# Patient Record
Sex: Male | Born: 1980
Health system: Southern US, Community
[De-identification: ages and names within clinical notes are randomized; demographics above are authoritative.]

## PROBLEM LIST (undated history)

## (undated) DIAGNOSIS — K589 Irritable bowel syndrome without diarrhea: Secondary | ICD-10-CM

## (undated) DIAGNOSIS — J45909 Unspecified asthma, uncomplicated: Secondary | ICD-10-CM

## (undated) DIAGNOSIS — R0602 Shortness of breath: Secondary | ICD-10-CM

## (undated) DIAGNOSIS — J189 Pneumonia, unspecified organism: Secondary | ICD-10-CM

## (undated) DIAGNOSIS — I10 Essential (primary) hypertension: Secondary | ICD-10-CM

## (undated) DIAGNOSIS — M549 Dorsalgia, unspecified: Secondary | ICD-10-CM

## (undated) DIAGNOSIS — M7989 Other specified soft tissue disorders: Secondary | ICD-10-CM

## (undated) DIAGNOSIS — R12 Heartburn: Secondary | ICD-10-CM

## (undated) DIAGNOSIS — F909 Attention-deficit hyperactivity disorder, unspecified type: Secondary | ICD-10-CM

## (undated) DIAGNOSIS — R030 Elevated blood-pressure reading, without diagnosis of hypertension: Secondary | ICD-10-CM

## (undated) DIAGNOSIS — M2550 Pain in unspecified joint: Secondary | ICD-10-CM

## (undated) DIAGNOSIS — F988 Other specified behavioral and emotional disorders with onset usually occurring in childhood and adolescence: Secondary | ICD-10-CM

## (undated) HISTORY — DX: Essential (primary) hypertension: I10

## (undated) HISTORY — DX: Heartburn: R12

## (undated) HISTORY — DX: Dorsalgia, unspecified: M54.9

## (undated) HISTORY — DX: Elevated blood-pressure reading, without diagnosis of hypertension: R03.0

## (undated) HISTORY — DX: Other specified behavioral and emotional disorders with onset usually occurring in childhood and adolescence: F98.8

## (undated) HISTORY — DX: Irritable bowel syndrome, unspecified: K58.9

## (undated) HISTORY — DX: Pain in unspecified joint: M25.50

## (undated) HISTORY — DX: Attention-deficit hyperactivity disorder, unspecified type: F90.9

## (undated) HISTORY — DX: Other specified soft tissue disorders: M79.89

## (undated) HISTORY — DX: Shortness of breath: R06.02

## (undated) HISTORY — DX: Unspecified asthma, uncomplicated: J45.909

## (undated) HISTORY — DX: Pneumonia, unspecified organism: J18.9

---

## 2011-05-07 ENCOUNTER — Other Ambulatory Visit: Payer: Self-pay | Admitting: Family Medicine

## 2011-05-07 DIAGNOSIS — R52 Pain, unspecified: Secondary | ICD-10-CM

## 2011-05-11 ENCOUNTER — Ambulatory Visit (HOSPITAL_COMMUNITY)
Admission: RE | Admit: 2011-05-11 | Discharge: 2011-05-11 | Disposition: A | Discharge status: Home / Self Care | Payer: BC Managed Care – PPO | Source: Ambulatory Visit | Attending: Family Medicine | Admitting: Family Medicine

## 2011-05-11 DIAGNOSIS — M545 Low back pain, unspecified: Secondary | ICD-10-CM | POA: Insufficient documentation

## 2011-05-11 DIAGNOSIS — M5126 Other intervertebral disc displacement, lumbar region: Secondary | ICD-10-CM | POA: Insufficient documentation

## 2011-05-11 DIAGNOSIS — R52 Pain, unspecified: Secondary | ICD-10-CM

## 2013-07-04 ENCOUNTER — Other Ambulatory Visit: Payer: Self-pay | Admitting: Family Medicine

## 2013-10-29 ENCOUNTER — Other Ambulatory Visit: Payer: Self-pay | Admitting: Family Medicine

## 2014-02-20 ENCOUNTER — Other Ambulatory Visit: Payer: Self-pay | Admitting: Family Medicine

## 2014-05-11 ENCOUNTER — Other Ambulatory Visit: Payer: Self-pay | Admitting: Family Medicine

## 2014-05-11 ENCOUNTER — Other Ambulatory Visit: Payer: Self-pay | Admitting: *Deleted

## 2014-05-11 MED ORDER — ALBUTEROL SULFATE HFA 108 (90 BASE) MCG/ACT IN AERS: INHALATION_SPRAY | RESPIRATORY_TRACT | Status: DC

## 2014-05-11 NOTE — Telephone Encounter (Signed)
???  not sure of message???

## 2014-05-11 NOTE — Telephone Encounter (Signed)
Is this our patient?

## 2014-05-11 NOTE — Telephone Encounter (Signed)
yes

## 2014-05-12 ENCOUNTER — Other Ambulatory Visit: Payer: Self-pay | Admitting: *Deleted

## 2014-05-12 ENCOUNTER — Encounter: Payer: Self-pay | Admitting: *Deleted

## 2014-06-25 ENCOUNTER — Other Ambulatory Visit: Payer: Self-pay | Admitting: Family Medicine

## 2014-08-03 ENCOUNTER — Other Ambulatory Visit: Payer: Self-pay | Admitting: Family Medicine

## 2014-10-02 ENCOUNTER — Other Ambulatory Visit: Payer: Self-pay | Admitting: Family Medicine

## 2014-10-08 ENCOUNTER — Ambulatory Visit (INDEPENDENT_AMBULATORY_CARE_PROVIDER_SITE_OTHER): Payer: BC Managed Care – PPO | Admitting: Family Medicine

## 2014-10-08 ENCOUNTER — Encounter: Payer: Self-pay | Admitting: Family Medicine

## 2014-10-08 VITALS — BP 132/90 | Ht 75.0 in | Wt 334.0 lb

## 2014-10-08 DIAGNOSIS — J4530 Mild persistent asthma, uncomplicated: Secondary | ICD-10-CM

## 2014-10-08 MED ORDER — ALBUTEROL SULFATE HFA 108 (90 BASE) MCG/ACT IN AERS: INHALATION_SPRAY | RESPIRATORY_TRACT | Status: DC

## 2014-10-08 NOTE — Progress Notes (Signed)
   Subjective:    Patient ID: Randy BarrowsBryan Navarro, male    DOB: 1981-02-03, 33 y.o.   MRN: [id number]  HPI Patient is here today to get a refill on his inhaler.  He has not been seen in over 2 years.  He would like to be switched to the Ventolin inhaler.   Patient has been on steroid inhalers and Advair in the past. Stated it did not help much. He prefers to use when necessary albuterol.  Nonsmoker. Not exercising these days. Some exposure to fumes.  Review of Systems No headache no chest pain no back pain no abdominal pain no change in bowel habits ROS otherwise negative    Objective:   Physical Exam Alert no apparent distress. Vital stable HEENT normal. Lungs clear. Heart regular in rhythm.       Assessment & Plan:  Impression asthma mild persistent with patient declining preventive inhalers. #2 substantial obesity discussed plan wellness exam encourage. Ventolin/Albuterol 2 sprays every 6 hours when necessary

## 2015-08-12 ENCOUNTER — Other Ambulatory Visit: Payer: Self-pay | Admitting: Neurosurgery

## 2015-08-12 DIAGNOSIS — M5440 Lumbago with sciatica, unspecified side: Secondary | ICD-10-CM

## 2015-08-18 ENCOUNTER — Ambulatory Visit
Admission: RE | Admit: 2015-08-18 | Discharge: 2015-08-18 | Disposition: A | Discharge status: Home / Self Care | Payer: BLUE CROSS/BLUE SHIELD | Source: Ambulatory Visit | Attending: Neurosurgery | Admitting: Neurosurgery

## 2015-08-18 DIAGNOSIS — M5440 Lumbago with sciatica, unspecified side: Secondary | ICD-10-CM

## 2015-11-07 ENCOUNTER — Other Ambulatory Visit: Payer: Self-pay | Admitting: *Deleted

## 2015-11-07 ENCOUNTER — Telehealth: Payer: Self-pay | Admitting: Family Medicine

## 2015-11-07 MED ORDER — ALBUTEROL SULFATE HFA 108 (90 BASE) MCG/ACT IN AERS: 2.0000 | INHALATION_SPRAY | RESPIRATORY_TRACT | Status: DC | PRN

## 2015-11-07 NOTE — Telephone Encounter (Signed)
Last seen 09/2014. Refilled one time. Pt notified.  Pt has scheduled ov.

## 2015-11-07 NOTE — Telephone Encounter (Signed)
Pt is needing a refill on his albuterol (PROVENTIL HFA;VENTOLIN HFA) 108 (90 BASE) MCG/ACT inhaler.        [facility]

## 2015-11-11 ENCOUNTER — Encounter: Payer: Self-pay | Admitting: Family Medicine

## 2015-11-11 ENCOUNTER — Ambulatory Visit (INDEPENDENT_AMBULATORY_CARE_PROVIDER_SITE_OTHER): Payer: 59 | Admitting: Family Medicine

## 2015-11-11 VITALS — BP 122/82 | Ht 75.0 in | Wt 342.2 lb

## 2015-11-11 DIAGNOSIS — Z139 Encounter for screening, unspecified: Secondary | ICD-10-CM

## 2015-11-11 DIAGNOSIS — J329 Chronic sinusitis, unspecified: Secondary | ICD-10-CM

## 2015-11-11 DIAGNOSIS — J4530 Mild persistent asthma, uncomplicated: Secondary | ICD-10-CM | POA: Diagnosis not present

## 2015-11-11 MED ORDER — ALBUTEROL SULFATE HFA 108 (90 BASE) MCG/ACT IN AERS: 2.0000 | INHALATION_SPRAY | RESPIRATORY_TRACT | Status: DC | PRN

## 2015-11-11 MED ORDER — PREDNISONE 20 MG PO TABS: ORAL_TABLET | ORAL | Status: DC

## 2015-11-11 MED ORDER — AMOXICILLIN-POT CLAVULANATE 875-125 MG PO TABS: ORAL_TABLET | ORAL | Status: DC

## 2015-11-11 NOTE — Progress Notes (Signed)
   Subjective:    Patient ID: Randy Navarro, male    DOB: May 08, 1981, 35 y.o.   MRN: [id number]  Asthma This is a chronic problem. The current episode started more than 1 year ago. His past medical history is significant for asthma.   Patient also has c/o acute  cough. Onset few weeks ago.still needing inhaler around twice per before sickness was around twice per wk  Patient also requesting orders for routine labs.  Around ten yrs.  Pt recognizes ovrweight and so starting to work ondiet     Review of Systems No headache no chest pain no back pain no abdominal pain    Objective:   Physical Exam Alert vitals stable. HEENT moderate his congestion pharynx normal lungs rare expiratory wheeze bronchial cough heart regular rhythm       Assessment & Plan:  Impression subacute rhinosinusitis/bronchitis with exacerbation of asthma plan prednisone taper. Antibiotics prescribed. Albuterol when necessary WSL screening blood work

## 2016-04-15 MED FILL — VENTOLIN HFA 90 MCG INHALER: 108 (90 BAS | 16 days supply | Qty: 18 | Fill #0

## 2016-06-27 DIAGNOSIS — J45909 Unspecified asthma, uncomplicated: Secondary | ICD-10-CM | POA: Diagnosis not present

## 2016-06-27 DIAGNOSIS — J3489 Other specified disorders of nose and nasal sinuses: Secondary | ICD-10-CM | POA: Diagnosis not present

## 2016-07-01 MED FILL — VENTOLIN HFA 90 MCG INHALER: 108 (90 BAS | 16 days supply | Qty: 18 | Fill #1

## 2016-09-21 MED FILL — VENTOLIN HFA 90 MCG INHALER: 108 (90 BAS | 16 days supply | Qty: 18 | Fill #2

## 2016-11-02 DIAGNOSIS — Z3141 Encounter for fertility testing: Secondary | ICD-10-CM | POA: Diagnosis not present

## 2016-11-27 ENCOUNTER — Other Ambulatory Visit: Payer: Self-pay | Admitting: *Deleted

## 2016-11-27 MED ORDER — ALBUTEROL SULFATE HFA 108 (90 BASE) MCG/ACT IN AERS: 2.0000 | INHALATION_SPRAY | RESPIRATORY_TRACT | 0 refills | Status: DC | PRN

## 2017-01-26 ENCOUNTER — Telehealth: Payer: Self-pay | Admitting: *Deleted

## 2017-01-26 ENCOUNTER — Other Ambulatory Visit: Payer: Self-pay

## 2017-01-26 ENCOUNTER — Other Ambulatory Visit: Payer: Self-pay | Admitting: *Deleted

## 2017-01-26 MED ORDER — ALBUTEROL SULFATE HFA 108 (90 BASE) MCG/ACT IN AERS: 2.0000 | INHALATION_SPRAY | RESPIRATORY_TRACT | 2 refills | Status: DC | PRN

## 2017-01-26 NOTE — Telephone Encounter (Signed)
Requesting refill on albuterol inhaler. Last seen 11/11/15. [facility]. Please call after refill sent.

## 2017-01-26 NOTE — Telephone Encounter (Signed)
Prescription faxed to pharmacy. Patient notified. 

## 2017-01-26 NOTE — Telephone Encounter (Signed)
May rx plus two ref

## 2017-01-27 MED FILL — VENTOLIN HFA 90 MCG INHALER: 108 (90 BAS | 16 days supply | Qty: 18 | Fill #0

## 2017-03-17 MED FILL — VENTOLIN HFA 90 MCG INHALER: 108 (90 BAS | 16 days supply | Qty: 18 | Fill #1

## 2017-05-25 MED FILL — VENTOLIN HFA 90 MCG INHALER: 108 (90 BAS | 16 days supply | Qty: 18 | Fill #2

## 2017-08-23 MED FILL — VENTOLIN HFA 90 MCG INHALER: 108 (90 BAS | 16 days supply | Qty: 18 | Fill #0

## 2017-11-18 ENCOUNTER — Other Ambulatory Visit: Payer: Self-pay | Admitting: Family Medicine

## 2017-11-19 MED FILL — VENTOLIN HFA 90 MCG INHALER: 108 (90 BAS | 16 days supply | Qty: 18 | Fill #0

## 2017-11-19 NOTE — Telephone Encounter (Signed)
Ok times one 

## 2017-12-03 ENCOUNTER — Ambulatory Visit (INDEPENDENT_AMBULATORY_CARE_PROVIDER_SITE_OTHER): Payer: 59 | Admitting: Family Medicine

## 2017-12-03 ENCOUNTER — Encounter: Payer: Self-pay | Admitting: Family Medicine

## 2017-12-03 VITALS — BP 132/86 | Temp 98.2°F | Wt 371.6 lb

## 2017-12-03 DIAGNOSIS — J4521 Mild intermittent asthma with (acute) exacerbation: Secondary | ICD-10-CM

## 2017-12-03 DIAGNOSIS — J329 Chronic sinusitis, unspecified: Secondary | ICD-10-CM | POA: Diagnosis not present

## 2017-12-03 MED ORDER — CLARITHROMYCIN 500 MG PO TABS: 500.0000 mg | ORAL_TABLET | Freq: Two times a day (BID) | ORAL | 0 refills | Status: DC

## 2017-12-03 MED ORDER — PREDNISONE 20 MG PO TABS: ORAL_TABLET | ORAL | 0 refills | Status: DC

## 2017-12-03 MED ORDER — ALBUTEROL SULFATE HFA 108 (90 BASE) MCG/ACT IN AERS: INHALATION_SPRAY | RESPIRATORY_TRACT | 3 refills | Status: DC

## 2017-12-03 NOTE — Progress Notes (Signed)
   Subjective:    Patient ID: Randy Navarro, male    DOB: 1980-11-03, 37 y.o.   MRN: [id number]  Cough  This is a new problem. The current episode started 1 to 4 weeks ago. Associated symptoms include nasal congestion and wheezing. Treatments tried: sinus med and inhaler.    Cong nd cough and sickness   Some productive cough  Using inhaler two three times per day   Feeling tiht at times  Notes ongoing symtoms     Review of Systems  Respiratory: Positive for cough and wheezing.        Objective:   Physical Exam  Alert active  no significant malaise HEENT mild nasal congestion otherwise normal pharynx normal lungs bronchial cough expiratory wheezes no tachypnea      Assessment & Plan:  Impression rhinosinusitis/bronchitis with exacerbation of reactive airways plan prednisone taper.  Albuterol.  Antibiotics prescribed symptom care discussed

## 2017-12-21 ENCOUNTER — Encounter: Payer: Self-pay | Admitting: Family Medicine

## 2017-12-21 ENCOUNTER — Ambulatory Visit (INDEPENDENT_AMBULATORY_CARE_PROVIDER_SITE_OTHER): Payer: 59 | Admitting: Family Medicine

## 2017-12-21 VITALS — BP 112/76 | Temp 98.9°F | Ht 75.0 in | Wt 366.0 lb

## 2017-12-21 DIAGNOSIS — J4541 Moderate persistent asthma with (acute) exacerbation: Secondary | ICD-10-CM | POA: Diagnosis not present

## 2017-12-21 MED ORDER — FLUTICASONE PROPIONATE HFA 44 MCG/ACT IN AERO: 2.0000 | INHALATION_SPRAY | Freq: Two times a day (BID) | RESPIRATORY_TRACT | 0 refills | Status: DC

## 2017-12-21 MED ORDER — PREDNISONE 20 MG PO TABS: 20.0000 mg | ORAL_TABLET | Freq: Every day | ORAL | 0 refills | Status: DC

## 2017-12-21 NOTE — Progress Notes (Signed)
   Subjective:    Patient ID: Randy BarrowsBryan Navarro, male    DOB: 15-Mar-1981, 37 y.o.   MRN: [id number]  Cough  This is a new problem. Episode onset: one month. Associated symptoms include headaches and wheezing. Associated symptoms comments: Vomiting, sore throat. Treatments tried: tylenol severe sinus, biaxin, prednisone, nyquil.    Saw three wks ago  Persist cough and whheezing  Productive at times gunky phlegm  Day and night coughing  Using oinhaler wseeral times per day  Generally when coughing a lot  No smoke in te house         Review of Systems  Respiratory: Positive for cough and wheezing.   Neurological: Positive for headaches.       Objective:   Physical Exam  Alert active good hydration mild nasal congestion pharynx normal lungs positive mild expiratory wheezes.  No tachypnea heart regular rate and rhythm.      Assessment & Plan:  Impression protracted flare of asthma after respiratory illness.  Patient has been on steroid inhalers in the past but it has been quite a few years.  Recommend aggressive treatment of allergies this spring.  Prednisone taper.  Will resume steroid inhaler and encouraged to maintain until early some

## 2018-01-03 ENCOUNTER — Other Ambulatory Visit: Payer: Self-pay | Admitting: Family Medicine

## 2018-01-11 MED FILL — VENTOLIN HFA 90 MCG INHALER: 108 (90 BAS | 16 days supply | Qty: 18 | Fill #0

## 2018-01-17 ENCOUNTER — Other Ambulatory Visit: Payer: Self-pay | Admitting: Family Medicine

## 2018-02-15 ENCOUNTER — Other Ambulatory Visit: Payer: Self-pay | Admitting: Family Medicine

## 2018-02-15 MED FILL — VENTOLIN HFA 90 MCG INHALER: 108 (90 BAS | 16 days supply | Qty: 18 | Fill #0

## 2018-03-21 MED FILL — VENTOLIN HFA 90 MCG INHALER: 108 (90 BAS | 16 days supply | Qty: 18 | Fill #1

## 2018-04-04 ENCOUNTER — Other Ambulatory Visit: Payer: Self-pay | Admitting: Family Medicine

## 2018-04-05 MED FILL — VENTOLIN HFA 90 MCG INHALER: 108 (90 BAS | 16 days supply | Qty: 18 | Fill #0

## 2018-11-08 ENCOUNTER — Other Ambulatory Visit: Payer: Self-pay | Admitting: Family Medicine

## 2018-11-17 MED FILL — VENTOLIN HFA 90 MCG INHALER: 108 (90 BAS | 16 days supply | Qty: 18 | Fill #1

## 2019-01-18 ENCOUNTER — Other Ambulatory Visit: Payer: Self-pay | Admitting: Family Medicine

## 2019-01-18 MED FILL — ALBUTEROL SULFATE HFA 108 (: 108 (90 BAS | 25 days supply | Qty: 9 | Fill #0

## 2019-01-18 NOTE — Telephone Encounter (Signed)
One yr virtual visit

## 2019-03-13 ENCOUNTER — Other Ambulatory Visit: Payer: Self-pay

## 2019-03-13 ENCOUNTER — Ambulatory Visit (INDEPENDENT_AMBULATORY_CARE_PROVIDER_SITE_OTHER): Payer: 59 | Admitting: Family Medicine

## 2019-03-13 ENCOUNTER — Encounter: Payer: Self-pay | Admitting: Family Medicine

## 2019-03-13 ENCOUNTER — Telehealth: Payer: Self-pay | Admitting: Family Medicine

## 2019-03-13 DIAGNOSIS — Z79899 Other long term (current) drug therapy: Secondary | ICD-10-CM

## 2019-03-13 DIAGNOSIS — Z13220 Encounter for screening for lipoid disorders: Secondary | ICD-10-CM | POA: Diagnosis not present

## 2019-03-13 DIAGNOSIS — R5383 Other fatigue: Secondary | ICD-10-CM | POA: Diagnosis not present

## 2019-03-13 DIAGNOSIS — J4541 Moderate persistent asthma with (acute) exacerbation: Secondary | ICD-10-CM

## 2019-03-13 MED ORDER — ALBUTEROL SULFATE HFA 108 (90 BASE) MCG/ACT IN AERS: INHALATION_SPRAY | RESPIRATORY_TRACT | 5 refills | Status: DC

## 2019-03-13 MED FILL — ALBUTEROL SULFATE HFA 108 (: 108 (90 BAS | 16 days supply | Qty: 18 | Fill #0

## 2019-03-13 NOTE — Progress Notes (Signed)
   Subjective:    Patient ID: Randy Navarro, male    DOB: Mar 09, 1981, 38 y.o.   MRN: [id number] Audio only  Patient calls with numerous concerns HPI Pt is here for follow up . Pt states his asthma has been pretty much under control. Pt states he would like to have a sleep study and routine blood work done. Pt states he does need refills on all meds.   Virtual Visit via Video Note  I connected with Randy Navarro on 03/13/19 at  1:40 PM EDT by a video enabled telemedicine application and verified that I am speaking with the correct person using two identifiers.  Location: Patient: home Provider: office   I discussed the limitations of evaluation and management by telemedicine and the availability of in person appointments. The patient expressed understanding and agreed to proceed.  History of Present Illness:    Observations/Objective:   Assessment and Plan:   Follow Up Instructions: Pt snores a lor, deep snores,   Periods of apnea, souse jostles   Daytime tiredness and fatigue   Snoring getting worse     I discussed the assessment and treatment plan with the patient. The patient was provided an opportunity to ask questions and all were answered. The patient agreed with the plan and demonstrated an understanding of the instructions.   The patient was advised to call back or seek an in-person evaluation if the symptoms worsen or if the condition fails to improve as anticipated.  I provided 25 minutes of non-face-to-face time during this encounter.  Asthma not as much of a problem.  Uses PRN albuterol.  Stopped using the steroid inhaler.  Concerned about ongoing weight gain.  Admits his diet is not very good.  Some family history of diabetes.  Would like some screening blood work to assess.  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  Morbid obesity ongoing weight gain dietary noncompliance.  Would like to do some blood work  3.  Probable sleep apnea.  Patient requests testing.  I think this is appropriate.  2 out of 3 positive with the [employer]

## 2019-03-13 NOTE — Telephone Encounter (Addendum)
ERROR

## 2019-04-05 ENCOUNTER — Telehealth: Payer: Self-pay | Admitting: Family Medicine

## 2019-04-05 DIAGNOSIS — R5383 Other fatigue: Secondary | ICD-10-CM

## 2019-04-05 NOTE — Telephone Encounter (Signed)
Pt called to request the order for the sleep study to be specifically for a home sleep test  Please advise & call pt when order has been placed

## 2019-04-06 NOTE — Telephone Encounter (Signed)
Home sleep study ordered in Epic. Patient notified of process and verbalized understanding.

## 2019-04-06 NOTE — Telephone Encounter (Signed)
Please go ahead with this order Nurses put in the order Then forward message to [facility] indicating message/order was put it When this is set up please make sure that the patient is aware of the process and that they should hear something within 3 weeks of the test if they have not heard anything regarding the results to let us know

## 2019-05-05 ENCOUNTER — Telehealth: Payer: Self-pay | Admitting: Family Medicine

## 2019-05-05 MED ORDER — CHLORZOXAZONE 500 MG PO TABS: ORAL_TABLET | ORAL | 0 refills | Status: DC

## 2019-05-05 NOTE — Telephone Encounter (Signed)
Patient pulled a muscle in his back. Making it hard to sleep at night and was wanting to get a muscle relaxer called in. No other symptoms.  No appts left for today to do a phone visit.  I told patient I would put a note back but couldn't guarantee we could call anything in.  [facility]

## 2019-05-05 NOTE — Telephone Encounter (Signed)
Please advise. Thank you

## 2019-05-05 NOTE — Telephone Encounter (Signed)
I recommend Parafon forte 500 mg, 1 taken 3 times daily as needed, #20, home use only If ongoing trouble follow-up here or urgent care

## 2019-05-05 NOTE — Telephone Encounter (Signed)
Parafon Forte 500 mg sent in to pharmacy. Pt has been notified and verbalized understanding.

## 2019-05-11 ENCOUNTER — Telehealth: Payer: Self-pay | Admitting: Family Medicine

## 2019-05-11 ENCOUNTER — Other Ambulatory Visit: Payer: Self-pay | Admitting: Family Medicine

## 2019-05-11 ENCOUNTER — Other Ambulatory Visit: Payer: Self-pay | Admitting: *Deleted

## 2019-05-11 MED ORDER — PREDNISONE 20 MG PO TABS: 20.0000 mg | ORAL_TABLET | Freq: Every day | ORAL | 0 refills | Status: DC

## 2019-05-11 MED ORDER — CHLORZOXAZONE 500 MG PO TABS: ORAL_TABLET | ORAL | 0 refills | Status: DC

## 2019-05-11 NOTE — Telephone Encounter (Signed)
[facility] if he has ongoing troubles definitely follow through with specialist as well

## 2019-05-11 NOTE — Telephone Encounter (Signed)
Pt would like to know if a steroid could be called in for back pian. He is scheduled to see an ortho doctor about an injection.   [facility]

## 2019-05-11 NOTE — Telephone Encounter (Signed)
Refill sent. Pt notified.

## 2019-05-11 NOTE — Telephone Encounter (Signed)
Discussed with pt and he also wanted to know if you could refill chloroxazone as well.

## 2019-05-11 NOTE — Telephone Encounter (Signed)
May have refill of the chlorzoxazone x1

## 2019-05-18 ENCOUNTER — Other Ambulatory Visit: Payer: Self-pay | Admitting: Sports Medicine

## 2019-05-18 DIAGNOSIS — M545 Low back pain, unspecified: Secondary | ICD-10-CM

## 2019-05-18 DIAGNOSIS — M5136 Other intervertebral disc degeneration, lumbar region: Secondary | ICD-10-CM | POA: Diagnosis not present

## 2019-05-22 DIAGNOSIS — S338XXA Sprain of other parts of lumbar spine and pelvis, initial encounter: Secondary | ICD-10-CM | POA: Diagnosis not present

## 2019-05-22 DIAGNOSIS — M4125 Other idiopathic scoliosis, thoracolumbar region: Secondary | ICD-10-CM | POA: Diagnosis not present

## 2019-05-22 DIAGNOSIS — S134XXA Sprain of ligaments of cervical spine, initial encounter: Secondary | ICD-10-CM | POA: Diagnosis not present

## 2019-05-23 ENCOUNTER — Ambulatory Visit: Payer: BLUE CROSS/BLUE SHIELD | Admitting: Orthopaedic Surgery

## 2019-05-26 DIAGNOSIS — M4125 Other idiopathic scoliosis, thoracolumbar region: Secondary | ICD-10-CM | POA: Diagnosis not present

## 2019-05-26 DIAGNOSIS — S338XXA Sprain of other parts of lumbar spine and pelvis, initial encounter: Secondary | ICD-10-CM | POA: Diagnosis not present

## 2019-05-26 DIAGNOSIS — S134XXA Sprain of ligaments of cervical spine, initial encounter: Secondary | ICD-10-CM | POA: Diagnosis not present

## 2019-06-23 ENCOUNTER — Other Ambulatory Visit: Payer: Self-pay

## 2019-06-23 ENCOUNTER — Ambulatory Visit
Admission: RE | Admit: 2019-06-23 | Discharge: 2019-06-23 | Disposition: A | Discharge status: Home / Self Care | Payer: BLUE CROSS/BLUE SHIELD | Source: Ambulatory Visit | Attending: Sports Medicine | Admitting: Sports Medicine

## 2019-06-23 DIAGNOSIS — M545 Low back pain, unspecified: Secondary | ICD-10-CM

## 2019-06-26 DIAGNOSIS — M5136 Other intervertebral disc degeneration, lumbar region: Secondary | ICD-10-CM | POA: Diagnosis not present

## 2019-06-26 DIAGNOSIS — M545 Low back pain: Secondary | ICD-10-CM | POA: Diagnosis not present

## 2019-06-26 DIAGNOSIS — M4125 Other idiopathic scoliosis, thoracolumbar region: Secondary | ICD-10-CM | POA: Diagnosis not present

## 2019-06-26 DIAGNOSIS — S134XXA Sprain of ligaments of cervical spine, initial encounter: Secondary | ICD-10-CM | POA: Diagnosis not present

## 2019-06-26 DIAGNOSIS — M47816 Spondylosis without myelopathy or radiculopathy, lumbar region: Secondary | ICD-10-CM | POA: Diagnosis not present

## 2019-06-26 DIAGNOSIS — S338XXA Sprain of other parts of lumbar spine and pelvis, initial encounter: Secondary | ICD-10-CM | POA: Diagnosis not present

## 2019-07-20 MED FILL — ALBUTEROL SULFATE HFA 108 (: 108 (90 BAS | 16 days supply | Qty: 18 | Fill #1

## 2019-08-07 DIAGNOSIS — M4125 Other idiopathic scoliosis, thoracolumbar region: Secondary | ICD-10-CM | POA: Diagnosis not present

## 2019-08-07 DIAGNOSIS — S134XXA Sprain of ligaments of cervical spine, initial encounter: Secondary | ICD-10-CM | POA: Diagnosis not present

## 2019-08-07 DIAGNOSIS — S338XXA Sprain of other parts of lumbar spine and pelvis, initial encounter: Secondary | ICD-10-CM | POA: Diagnosis not present

## 2019-08-14 DIAGNOSIS — S338XXA Sprain of other parts of lumbar spine and pelvis, initial encounter: Secondary | ICD-10-CM | POA: Diagnosis not present

## 2019-08-14 DIAGNOSIS — S134XXA Sprain of ligaments of cervical spine, initial encounter: Secondary | ICD-10-CM | POA: Diagnosis not present

## 2019-08-14 DIAGNOSIS — M4125 Other idiopathic scoliosis, thoracolumbar region: Secondary | ICD-10-CM | POA: Diagnosis not present

## 2019-10-10 MED FILL — ALBUTEROL SULFATE HFA 108 (: 108 (90 BAS | 16 days supply | Qty: 18 | Fill #2

## 2019-10-11 ENCOUNTER — Encounter: Payer: Self-pay | Admitting: Family Medicine

## 2019-10-11 ENCOUNTER — Ambulatory Visit: Payer: BLUE CROSS/BLUE SHIELD | Attending: Internal Medicine

## 2019-10-11 ENCOUNTER — Ambulatory Visit (INDEPENDENT_AMBULATORY_CARE_PROVIDER_SITE_OTHER): Payer: 59 | Admitting: Family Medicine

## 2019-10-11 ENCOUNTER — Other Ambulatory Visit: Payer: Self-pay

## 2019-10-11 DIAGNOSIS — J329 Chronic sinusitis, unspecified: Secondary | ICD-10-CM | POA: Diagnosis not present

## 2019-10-11 DIAGNOSIS — J310 Chronic rhinitis: Secondary | ICD-10-CM | POA: Diagnosis not present

## 2019-10-11 DIAGNOSIS — Z20828 Contact with and (suspected) exposure to other viral communicable diseases: Secondary | ICD-10-CM | POA: Diagnosis not present

## 2019-10-11 DIAGNOSIS — Z20822 Contact with and (suspected) exposure to covid-19: Secondary | ICD-10-CM

## 2019-10-11 MED ORDER — CEFDINIR 300 MG PO CAPS: ORAL_CAPSULE | ORAL | 0 refills | Status: DC

## 2019-10-11 NOTE — Progress Notes (Signed)
   Subjective:  Audio only  Patient ID: Randy Navarro, male    DOB: [birth year], 38 y.o.   MRN: [id number]  Sinusitis This is a new problem. The current episode started in the past 7 days. Associated symptoms include congestion. (Drainage at night, sore throat in the morning.) Treatments tried: Tylenol Cold and Sinus. The treatment provided mild relief.   Virtual Visit via Telephone Note  I connected with Randy Navarro on 10/11/19 at  9:30 AM EST by telephone and verified that I am speaking with the correct person using two identifiers.  Location: Patient: home Provider: office   I discussed the limitations, risks, security and privacy concerns of performing an evaluation and management service by telephone and the availability of in person appointments. I also discussed with the patient that there may be a patient responsible charge related to this service. The patient expressed understanding and agreed to proceed.   History of Present Illness:    Observations/Objective:   Assessment and Plan:   Follow Up Instructions:    I discussed the assessment and treatment plan with the patient. The patient was provided an opportunity to ask questions and all were answered. The patient agreed with the plan and demonstrated an understanding of the instructions.   The patient was advised to call back or seek an in-person evaluation if the symptoms worsen or if the condition fails to improve as anticipated.  I provided 20 minutes of non-face-to-face time during this encounter. Alert, mild malaise. Hydration good Vitals stable. frontal/ maxillary tenderness evident positive nasal congestion. pharynx normal neck supple  lungs clear/no crackles or wheezes. heart regular in rhythm Patient has history of reactive airways.  Notes minimal wheezing with this episode.  Occasional cough  Randy Males, LPN  Started  3 d ago  Sinus drainage in the moern    Sore throt   No known exposure  Works  at [employer]  All wears a mask  Mild ha in front  No gunky stufff  No wheezing  Review of Systems  HENT: Positive for congestion.        Objective:   Physical Exam  Virtual      Assessment & Plan:  Impression potential rhinosinusitis.  Antibiotics prescribed symptom care discussed.  Albuterol as needed for wheezing.  Recommend COVID-19 testing rationale discussed

## 2019-10-13 LAB — NOVEL CORONAVIRUS, NAA: SARS-CoV-2, NAA: NOT DETECTED

## 2019-10-18 ENCOUNTER — Encounter: Payer: Self-pay | Admitting: Family Medicine

## 2019-10-18 ENCOUNTER — Telehealth: Payer: Self-pay | Admitting: Family Medicine

## 2019-10-18 NOTE — Telephone Encounter (Signed)
Ok, write i'll sign

## 2019-10-18 NOTE — Telephone Encounter (Signed)
done

## 2019-10-18 NOTE — Telephone Encounter (Signed)
Please advise. Thank you

## 2019-10-18 NOTE — Telephone Encounter (Signed)
Please give patient work note via [employer]. Pt is needing it to say he is cleared to come back to work. Thank you.

## 2019-10-18 NOTE — Telephone Encounter (Signed)
Pt is needing a note stating he can go back to work. He is unable to go back to work until he has a work note. Pt is not having more symptoms and is feeling better.

## 2019-10-27 ENCOUNTER — Other Ambulatory Visit: Payer: BLUE CROSS/BLUE SHIELD

## 2019-10-30 ENCOUNTER — Ambulatory Visit: Payer: BLUE CROSS/BLUE SHIELD | Attending: Internal Medicine

## 2019-10-30 ENCOUNTER — Other Ambulatory Visit: Payer: Self-pay

## 2019-10-30 DIAGNOSIS — Z20822 Contact with and (suspected) exposure to covid-19: Secondary | ICD-10-CM | POA: Diagnosis not present

## 2019-10-31 LAB — NOVEL CORONAVIRUS, NAA: SARS-CoV-2, NAA: NOT DETECTED

## 2019-11-08 ENCOUNTER — Telehealth: Payer: Self-pay | Admitting: Family Medicine

## 2019-11-08 NOTE — Telephone Encounter (Signed)
Pt seen 10/11/2019 for sinus issues; had test on 10/30/2019. Please advise. Thank you

## 2019-11-08 NOTE — Telephone Encounter (Signed)
Pt is requesting a work note for 1/15-1/27 to return to work tomorrow. He was exposed to covid and had to quarantine and also had the test done as well.

## 2019-11-08 NOTE — Telephone Encounter (Signed)
Yes wis

## 2019-11-09 ENCOUNTER — Encounter: Payer: Self-pay | Admitting: Family Medicine

## 2019-11-13 ENCOUNTER — Encounter: Payer: Self-pay | Admitting: Family Medicine

## 2020-01-31 MED FILL — ALBUTEROL SULFATE HFA 108 (: 108 (90 BAS | 16 days supply | Qty: 18 | Fill #3

## 2020-04-02 ENCOUNTER — Other Ambulatory Visit: Payer: Self-pay | Admitting: Family Medicine

## 2020-04-02 MED FILL — ALBUTEROL SULFATE HFA 108 (: 108 (90 BAS | 17 days supply | Qty: 18 | Fill #0

## 2020-04-02 NOTE — Telephone Encounter (Signed)
Please contact patient to set up appt; then may route back to nurses. Thank you  

## 2020-04-02 NOTE — Telephone Encounter (Signed)
Seen 10/11/19 for wheezing and has an upcoming appt

## 2020-04-02 NOTE — Telephone Encounter (Signed)
Scheduled 7/20

## 2020-04-05 DIAGNOSIS — H53021 Refractive amblyopia, right eye: Secondary | ICD-10-CM | POA: Diagnosis not present

## 2020-04-05 DIAGNOSIS — H52223 Regular astigmatism, bilateral: Secondary | ICD-10-CM | POA: Diagnosis not present

## 2020-04-05 DIAGNOSIS — H5203 Hypermetropia, bilateral: Secondary | ICD-10-CM | POA: Diagnosis not present

## 2020-04-30 ENCOUNTER — Ambulatory Visit: Payer: 59 | Admitting: Family Medicine

## 2020-04-30 ENCOUNTER — Encounter: Payer: Self-pay | Admitting: Family Medicine

## 2020-04-30 ENCOUNTER — Other Ambulatory Visit: Payer: Self-pay

## 2020-04-30 VITALS — BP 132/86 | HR 85 | Temp 97.0°F | Ht 75.0 in | Wt 399.4 lb

## 2020-04-30 DIAGNOSIS — J4530 Mild persistent asthma, uncomplicated: Secondary | ICD-10-CM | POA: Diagnosis not present

## 2020-04-30 DIAGNOSIS — Z0000 Encounter for general adult medical examination without abnormal findings: Secondary | ICD-10-CM

## 2020-04-30 DIAGNOSIS — M722 Plantar fascial fibromatosis: Secondary | ICD-10-CM

## 2020-04-30 NOTE — Patient Instructions (Addendum)
1 wk before physical get labs, fasting.  Plantar Fasciitis Rehab Ask your health care provider which exercises are safe for you. Do exercises exactly as told by your health care provider and adjust them as directed. It is normal to feel mild stretching, pulling, tightness, or discomfort as you do these exercises. Stop right away if you feel sudden pain or your pain gets worse. Do not begin these exercises until told by your health care provider. Stretching and range-of-motion exercises These exercises warm up your muscles and joints and improve the movement and flexibility of your foot. These exercises also help to relieve pain. Plantar fascia stretch  1. Sit with your left / right leg crossed over your opposite knee. 2. Hold your heel with one hand with that thumb near your arch. With your other hand, hold your toes and gently pull them back toward the top of your foot. You should feel a stretch on the bottom of your toes or your foot (plantar fascia) or both. 3. Hold this stretch for__________ seconds. 4. Slowly release your toes and return to the starting position. Repeat __________ times. Complete this exercise __________ times a day. Gastrocnemius stretch, standing This exercise is also called a calf (gastroc) stretch. It stretches the muscles in the back of the upper calf. 1. Stand with your hands against a wall. 2. Extend your left / right leg behind you, and bend your front knee slightly. 3. Keeping your heels on the floor and your back knee straight, shift your weight toward the wall. Do not arch your back. You should feel a gentle stretch in your upper left / right calf. 4. Hold this position for __________ seconds. Repeat __________ times. Complete this exercise __________ times a day. Soleus stretch, standing This exercise is also called a calf (soleus) stretch. It stretches the muscles in the back of the lower calf. 1. Stand with your hands against a wall. 2. Extend your left /  right leg behind you, and bend your front knee slightly. 3. Keeping your heels on the floor, bend your back knee and shift your weight slightly over your back leg. You should feel a gentle stretch deep in your lower calf. 4. Hold this position for __________ seconds. Repeat __________ times. Complete this exercise __________ times a day. Gastroc and soleus stretch, standing step This exercise stretches the muscles in the back of the lower leg. These muscles are in the upper calf (gastrocnemius) and the lower calf (soleus). 1. Stand with the ball of your left / right foot on a step. The ball of your foot is on the walking surface, right under your toes. 2. Keep your other foot firmly on the same step. 3. Hold on to the wall or a railing for balance. 4. Slowly lift your other foot, allowing your body weight to press your left / right heel down over the edge of the step. You should feel a stretch in your left / right calf. 5. Hold this position for __________ seconds. 6. Return both feet to the step. 7. Repeat this exercise with a slight bend in your left / right knee. Repeat __________ times with your left / right knee straight and __________ times with your left / right knee bent. Complete this exercise __________ times a day. Balance exercise This exercise builds your balance and strength control of your arch to help take pressure off your plantar fascia. Single leg stand If this exercise is too easy, you can try it with your eyes  closed or while standing on a pillow. 1. Without shoes, stand near a railing or in a doorway. You may hold on to the railing or door frame as needed. 2. Stand on your left / right foot. Keep your big toe down on the floor and try to keep your arch lifted. Do not let your foot roll inward. 3. Hold this position for __________ seconds. Repeat __________ times. Complete this exercise __________ times a day. This information is not intended to replace advice given to you  by your health care provider. Make sure you discuss any questions you have with your health care provider. Document Revised: 01/19/2019 Document Reviewed: 07/27/2018 Elsevier Patient Education  2020 [employer].

## 2020-04-30 NOTE — Progress Notes (Signed)
Patient ID: Randy Navarro, male    DOB: 04/12/1981, 39 y.o.   MRN: [id number]   Chief Complaint  Patient presents with  . asthma follow up   Subjective:    HPI  Pt seen for f/u asthma. Pt hasn't had covid this past year. Feeling well.  No new breathing issues. Using albuterol and flovent. Not taking flovent daily. Using albuterol as needed 1x per day or none. No night time cough or wheezing.  Mom- diabetes on mom side,  Father side of family- heart attacks. Cancers-none that know of.  Medical History Randy Navarro has a past medical history of ADHD (attention deficit hyperactivity disorder), Asthma, Blood pressure elevated without history of HTN, IBS (irritable bowel syndrome), and Pneumonia (age 8 year).   Outpatient Encounter Medications as of 04/30/2020  Medication Sig  . albuterol (VENTOLIN HFA) 108 (90 Base) MCG/ACT inhaler INHALE 2 PUFFS BY MOUTH INTO LUNGS EVERY 4 HOURS AS NEEDED FOR WHEEZING OR SHORTNESS OF BREATH  . FLOVENT HFA 44 MCG/ACT inhaler TAKE 2 PUFFS BY MOUTH TWICE A DAY (Patient not taking: Reported on 04/30/2020)  . [DISCONTINUED] cefdinir (OMNICEF) 300 MG capsule Take one capsule po BID for 10 days  . [DISCONTINUED] chlorzoxazone (PARAFON FORTE DSC) 500 MG tablet Take one tablet by mouth 3 times day prn. HOME USE ONLY (Patient not taking: Reported on 10/11/2019)  . [DISCONTINUED] predniSONE (DELTASONE) 20 MG tablet Take 1 tablet (20 mg total) by mouth daily with breakfast. Take 3 qd for 3 days, then 2 qd for 3 days, then one qd for 3 days (Patient not taking: Reported on 10/11/2019)   No facility-administered encounter medications on file as of 04/30/2020.     Review of Systems  Constitutional: Negative for chills and fever.  HENT: Negative for congestion, rhinorrhea and sore throat.   Respiratory: Negative for cough, shortness of breath and wheezing.   Cardiovascular: Negative for chest pain and leg swelling.  Gastrointestinal: Negative for abdominal pain,  diarrhea, nausea and vomiting.  Genitourinary: Negative for dysuria and frequency.  Musculoskeletal:       +pain in inner heel/plantar of left foot.  Skin: Negative for rash.  Neurological: Negative for dizziness, weakness and headaches.     Vitals BP 132/86   Pulse 85   Temp (!) 97 F (36.1 C) (Oral)   Ht '6\' 3"'$  (1.905 m)   Wt (!) 399 lb 6.4 oz (181.2 kg)   SpO2 98%   BMI 49.92 kg/m   Objective:   Physical Exam Vitals and nursing note reviewed.  Constitutional:      General: He is not in acute distress.    Appearance: Normal appearance. He is obese. He is not ill-appearing.  HENT:     Head: Normocephalic.     Nose: Nose normal. No congestion.     Mouth/Throat:     Mouth: Mucous membranes are moist.     Pharynx: No oropharyngeal exudate.  Eyes:     Extraocular Movements: Extraocular movements intact.     Conjunctiva/sclera: Conjunctivae normal.     Pupils: Pupils are equal, round, and reactive to light.  Cardiovascular:     Rate and Rhythm: Normal rate and regular rhythm.     Pulses: Normal pulses.     Heart sounds: Normal heart sounds. No murmur heard.   Pulmonary:     Effort: Pulmonary effort is normal. No respiratory distress.     Breath sounds: Normal breath sounds. No wheezing, rhonchi or rales.  Musculoskeletal:  General: Normal range of motion.     Right lower leg: No edema.     Left lower leg: No edema.  Skin:    General: Skin is warm and dry.     Findings: No rash.  Neurological:     General: No focal deficit present.     Mental Status: He is alert and oriented to person, place, and time.     Cranial Nerves: No cranial nerve deficit.  Psychiatric:        Mood and Affect: Mood normal.        Behavior: Behavior normal.        Thought Content: Thought content normal.        Judgment: Judgment normal.      Assessment and Plan   1. Mild persistent asthma without complication  2. Laboratory tests ordered as part of a complete physical exam  (CPE) - CBC - CMP14+EGFR - Hemoglobin A1c - Lipid panel - TSH  3. Plantar fasciitis of left foot   Asthma-stable, controlled-cont flovent and albuterol prn.  Plantar fasciitis- Aleve bid, ice water bottle roller 3x per day.  Handout on exercises given. Call or rto if not improving in 2-3 wks.  F/u 47mofor cpe/labs

## 2020-05-11 DIAGNOSIS — Z23 Encounter for immunization: Secondary | ICD-10-CM | POA: Diagnosis not present

## 2020-06-08 DIAGNOSIS — Z23 Encounter for immunization: Secondary | ICD-10-CM | POA: Diagnosis not present

## 2020-07-02 MED FILL — ALBUTEROL SULFATE HFA 108 (: 108 (90 BAS | 17 days supply | Qty: 18 | Fill #1

## 2020-09-02 ENCOUNTER — Ambulatory Visit: Payer: 59 | Admitting: Family Medicine

## 2020-09-25 ENCOUNTER — Other Ambulatory Visit: Payer: Self-pay | Admitting: Family Medicine

## 2020-09-26 ENCOUNTER — Other Ambulatory Visit: Payer: Self-pay | Admitting: Family Medicine

## 2020-09-26 ENCOUNTER — Telehealth: Payer: Self-pay

## 2020-09-26 MED ORDER — ALBUTEROL SULFATE HFA 108 (90 BASE) MCG/ACT IN AERS: 2.0000 | INHALATION_SPRAY | Freq: Four times a day (QID) | RESPIRATORY_TRACT | 2 refills | Status: DC | PRN

## 2020-09-26 MED FILL — ALBUTEROL SULFATE HFA 108 (: 108 (90 BAS | 25 days supply | Qty: 18 | Fill #0

## 2020-09-26 NOTE — Telephone Encounter (Signed)
Pt contacted and verbalized understanding.  

## 2020-09-26 NOTE — Telephone Encounter (Signed)
Please advise. Thank you

## 2020-09-26 NOTE — Telephone Encounter (Signed)
Patient is checking on request for refill on albuterol 108 inhaler called into [place] [facility]

## 2020-10-12 ENCOUNTER — Encounter (HOSPITAL_COMMUNITY): Payer: Self-pay | Admitting: Emergency Medicine

## 2020-10-12 ENCOUNTER — Emergency Department (HOSPITAL_COMMUNITY)
Admission: EM | Admit: 2020-10-12 | Discharge: 2020-10-12 | Disposition: A | Discharge status: Home / Self Care | Payer: 59 | Attending: Emergency Medicine | Admitting: Emergency Medicine

## 2020-10-12 ENCOUNTER — Other Ambulatory Visit: Payer: Self-pay

## 2020-10-12 DIAGNOSIS — M549 Dorsalgia, unspecified: Secondary | ICD-10-CM | POA: Diagnosis not present

## 2020-10-12 DIAGNOSIS — J4530 Mild persistent asthma, uncomplicated: Secondary | ICD-10-CM | POA: Insufficient documentation

## 2020-10-12 DIAGNOSIS — M5441 Lumbago with sciatica, right side: Secondary | ICD-10-CM | POA: Insufficient documentation

## 2020-10-12 DIAGNOSIS — R52 Pain, unspecified: Secondary | ICD-10-CM | POA: Diagnosis not present

## 2020-10-12 DIAGNOSIS — F17210 Nicotine dependence, cigarettes, uncomplicated: Secondary | ICD-10-CM | POA: Diagnosis not present

## 2020-10-12 DIAGNOSIS — M5442 Lumbago with sciatica, left side: Secondary | ICD-10-CM | POA: Insufficient documentation

## 2020-10-12 DIAGNOSIS — R000 Tachycardia, unspecified: Secondary | ICD-10-CM | POA: Diagnosis not present

## 2020-10-12 MED ORDER — MORPHINE SULFATE (PF) 4 MG/ML IV SOLN
4.0000 mg | Freq: Once | INTRAVENOUS | Status: AC
  Administered 2020-10-12: 4 mg via INTRAVENOUS
  Filled 2020-10-12: qty 1

## 2020-10-12 MED ORDER — DEXAMETHASONE SODIUM PHOSPHATE 10 MG/ML IJ SOLN
10.0000 mg | Freq: Once | INTRAMUSCULAR | Status: AC
  Administered 2020-10-12: 10 mg via INTRAVENOUS
  Filled 2020-10-12: qty 1

## 2020-10-12 MED ORDER — OXYCODONE-ACETAMINOPHEN 5-325 MG PO TABS: 1.0000 | ORAL_TABLET | Freq: Four times a day (QID) | ORAL | 0 refills | Status: DC | PRN

## 2020-10-12 MED ORDER — METHOCARBAMOL 500 MG PO TABS: 500.0000 mg | ORAL_TABLET | Freq: Two times a day (BID) | ORAL | 0 refills | Status: DC

## 2020-10-12 MED ORDER — KETOROLAC TROMETHAMINE 30 MG/ML IJ SOLN
30.0000 mg | Freq: Once | INTRAMUSCULAR | Status: AC
  Administered 2020-10-12: 30 mg via INTRAVENOUS
  Filled 2020-10-12: qty 1

## 2020-10-12 MED ORDER — PREDNISONE 20 MG PO TABS: 60.0000 mg | ORAL_TABLET | Freq: Every day | ORAL | 0 refills | Status: AC

## 2020-10-12 MED ORDER — NAPROXEN 500 MG PO TABS: 500.0000 mg | ORAL_TABLET | Freq: Two times a day (BID) | ORAL | 0 refills | Status: DC

## 2020-10-12 MED ORDER — LIDOCAINE 5 % EX PTCH
1.0000 | MEDICATED_PATCH | Freq: Once | CUTANEOUS | Status: DC
  Administered 2020-10-12: 1 via TRANSDERMAL
  Filled 2020-10-12: qty 1

## 2020-10-12 MED ORDER — METHOCARBAMOL 1000 MG/10ML IJ SOLN
1000.0000 mg | Freq: Once | INTRAVENOUS | Status: AC
  Administered 2020-10-12: 1000 mg via INTRAVENOUS
  Filled 2020-10-12: qty 10

## 2020-10-12 NOTE — ED Notes (Signed)
Pt wheeled to waiting room. Pt verbalized understanding of discharge instructions.   

## 2020-10-12 NOTE — Discharge Instructions (Signed)
HOME INSTRUCTIONS Self - care:  The application of heat can help soothe the pain.  Maintaining your daily activities, including walking (this is encouraged), as it will help you get better faster than just staying in bed. Do not life, push, pull anything more than 10 pounds for the next week. I am attaching back exercises that you can do at home to help facilitate your recovery.   Back Exercises - I have attached a handout on back exercises that can be done at home to help facilitate your recovery.   Medications are also useful to help with pain control.   Acetaminophen.  This medication is generally safe, and found over the counter. Take as directed for your age. You should not take more than 8 of the extra strength (500mg ) pills a day (max dose is 4000mg  total OVER one day)  Non steroidal anti inflammatory: This includes medications including Ibuprofen, naproxen and Mobic; These medications help both pain and swelling and are very useful in treating back pain.  They should be taken with food, as they can cause stomach upset, and more seriously, stomach bleeding. Do not combine the medications.   Lidocaine Patch: Salon Pas lidocaine patches (blue and silver box) can be purchased over the counter and worn for 12 hours for local pain relief   Percocet: This is an opioid pain medication that can be used for severe breakthrough pain, use caution when taking this medication, it can cause drowsiness, do not take before driving.  Muscle relaxants:  These medications can help with muscle tightness that is a cause of lower back pain.  Most of these medications can cause drowsiness, and it is not safe to drive or use dangerous machinery while taking them. They are primarily helpful when taken at night before sleep.  Prednisone - This is an oral steroid.  This medication is best taken with food in the morning.  Please note that this medication can cause anxiety, mood swings, muscle fatigue, increased hunger,  weight gain (sodium/fluid retention), poor sleep as well as other symptoms. If you are a diabetic, please monitor your blood sugars at home as this medication can increase your blood sugars. Call your pharmacist if you have any questions.  You will need to follow up with your primary healthcare provider or neurosurgeon in 1-2 weeks for reassessment and persistent symptoms.  Be aware that if you develop new symptoms, such as a fever, leg weakness, difficulty with or loss of control of your urine or bowels, abdominal pain, or more severe pain, you will need to seek medical attention and/or return to the Emergency department. Additional Information:  Your vital signs today were: BP 124/78 (BP Location: Left Arm)    Pulse 83    Temp 98.4 F (36.9 C) (Oral)    Resp 18    Ht 6\' 3"  (1.905 m)    Wt (!) 172.4 kg    SpO2 95%    BMI 47.50 kg/m  If your blood pressure (BP) was elevated above 135/85 this visit, please have this repeated by your doctor within one month. ---------------

## 2020-10-12 NOTE — ED Provider Notes (Signed)
[facility] EMERGENCY DEPARTMENT Provider Note   CSN: [id number] Arrival date & time: 10/12/20  1150     History Chief Complaint  Patient presents with  . Back Pain    Randy Navarro is a 40 y.o. male.  Randy Navarro is a 40 y.o. male with history of low back pain and herniated disc, who presents to the ED for evaluation of a flareup of his back pain.  Reports that this episode of pain began yesterday.  He reports a history of similar flareups.  He is unsure what triggered this flareup.  Denies heavy lifting or increased physical activity, has been spending a lot of time in the car and wonders if this could be contributing.  He states this flareup seems more severe than others he has had in the past.  And the pain is affecting his ability to walk and move around which is why they called EMS today.  He was given 100 mcg of fentanyl in route and he reports some improvement in pain but he is still having persistent back spasms.  Pain is present bilaterally but worse on the left side of his back.  No injury or trauma.  It does radiate into both legs which is typical for him with all of his flareups.  Denies any loss of bowel or bladder control or saddle anesthesia.  No associated numbness or weakness in his lower extremities, reports just pain.  No associated abdominal pain or dysuria.  No fevers or chills.  No history of cancer or IV drug use.  He reports that yesterday he took a Robaxin which seemed to temporarily improve spasms, this was given to him by a friend and this morning when pain was very severe if he took a tramadol from his dad, tried ibuprofen without much relief.  Has not tried anything else.  Has previously seen an orthopedist as well as Dr. Franky Macho with [facility], but has not had any surgeries or injections previously.        Past Medical History:  Diagnosis Date  . ADHD (attention deficit hyperactivity disorder)   . Asthma   . Blood pressure elevated without history of  HTN   . IBS (irritable bowel syndrome)   . Pneumonia age 57 year    Patient Active Problem List   Diagnosis Date Noted  . Mild persistent asthma 10/08/2014    History reviewed. No pertinent surgical history.     History reviewed. No pertinent family history.  Social History   Tobacco Use  . Smoking status: Light Tobacco Smoker    Packs/day: 0.10    Types: Cigarettes  . Smokeless tobacco: Current User    Types: Chew  . Tobacco comment:  about 20 yrs.  Substance Use Topics  . Alcohol use: Yes    Alcohol/week: 0.0 standard drinks    Comment: 4 beers per month  . Drug use: Never    Home Medications Prior to Admission medications   Medication Sig Start Date End Date Taking? Authorizing Provider  albuterol (VENTOLIN HFA) 108 (90 Base) MCG/ACT inhaler Inhale 2 puffs into the lungs every 6 (six) hours as needed for wheezing or shortness of breath. 09/26/20   Laroy Apple M, DO  FLOVENT HFA 44 MCG/ACT inhaler TAKE 2 PUFFS BY MOUTH TWICE A DAY Patient not taking: Reported on 04/30/2020 01/17/18   Merlyn Albert, MD    Allergies    Patient has no known allergies.  Review of Systems   Review of Systems  Constitutional: Negative for chills and fever.  HENT: Negative.   Respiratory: Negative for shortness of breath.   Cardiovascular: Negative for chest pain.  Gastrointestinal: Negative for abdominal pain, constipation, diarrhea, nausea and vomiting.  Genitourinary: Negative for dysuria, flank pain, frequency and hematuria.  Musculoskeletal: Positive for back pain. Negative for arthralgias, gait problem, joint swelling, myalgias and neck pain.  Skin: Negative for color change, rash and wound.  Neurological: Negative for weakness and numbness.    Physical Exam Updated Vital Signs BP (!) 164/92 (BP Location: Right Arm)   Pulse 100   Temp 98.4 F (36.9 C) (Oral)   Resp 20   Ht 6\' 3"  (1.905 m)   Wt (!) 172.4 kg   SpO2 94%   BMI 47.50 kg/m   Physical Exam Vitals and  nursing note reviewed.  Constitutional:      General: He is not in acute distress.    Appearance: Normal appearance. He is well-developed and well-nourished. He is obese. He is not diaphoretic.     Comments: Obese male, alert, appears uncomfortable but is in no acute distress.  HENT:     Head: Atraumatic.  Eyes:     General:        Right eye: No discharge.        Left eye: No discharge.  Cardiovascular:     Rate and Rhythm: Normal rate and regular rhythm.     Pulses:          Radial pulses are 2+ on the right side and 2+ on the left side.       Dorsalis pedis pulses are 2+ on the right side and 2+ on the left side.       Posterior tibial pulses are 2+ on the right side and 2+ on the left side.     Heart sounds: Normal heart sounds.  Pulmonary:     Effort: Pulmonary effort is normal. No respiratory distress.     Breath sounds: Normal breath sounds.  Abdominal:     General: Bowel sounds are normal. There is no distension.     Palpations: Abdomen is soft. There is no mass.     Tenderness: There is no abdominal tenderness. There is no guarding.     Comments: Abdomen soft, nondistended, nontender to palpation in all quadrants without guarding or peritoneal signs, no CVA tenderness bilaterally  Musculoskeletal:     Cervical back: Neck supple.     Comments: Tenderness to palpation across the low back, worse on the left than right, with palpable spasm.  Pain made worse with range of motion of the lower extremities, positive straight leg raise bilaterally  Skin:    General: Skin is warm and dry.     Capillary Refill: Capillary refill takes less than 2 seconds.  Neurological:     Mental Status: He is alert and oriented to person, place, and time.     Comments: Alert, clear speech, following commands. Moving all extremities without difficulty. Bilateral lower extremities with 5/5 strength in proximal and distal muscle groups and with dorsi and plantar flexion. Sensation intact in bilateral  lower extremities. 2+ patellar DTRs bilaterally. Ambulatory with steady gait  Psychiatric:        Mood and Affect: Mood and affect normal.        Behavior: Behavior normal.     ED Results / Procedures / Treatments   Labs (all labs ordered are listed, but only abnormal results are displayed) Labs Reviewed - No data to  display  EKG None  Radiology No results found.  Procedures Procedures (including critical care time)  Medications Ordered in ED Medications - No data to display  ED Course  I have reviewed the triage vital signs and the nursing notes.  Pertinent labs & imaging results that were available during my care of the patient were reviewed by me and considered in my medical decision making (see chart for details).    MDM Rules/Calculators/A&P                          Patient presents with complaint of back pain.  History of the same with multiple similar flareups, known herniated disc.  Patient is nontoxic appearing, vitals are WNL. Patient has normal neurologic exam, no point/focal midline tenderness to palpation, diffuse tenderness and palpable spasm across the low back. Ambulatory in the ED.  No back pain red flags. No urinary sxs. Most likely muscle strain versus spasm. Considered UTI/pyelonephritis, kidney stone, aortic aneurysm/dissection, cauda equina or epidural abscess however these do not feel these diagnoses fit clinical picture at this time.  Pain treated and improved in the ED with improved mobility.  Will treat with Percocet, steroids, naproxen and Robaxin, discussed with patient that they are not to drive or operate heavy machinery while taking Percocet and Robaxin. I discussed treatment plan, need for PCP/spine specialist follow-up, and return precautions with the patient. Provided opportunity for questions, patient confirmed understanding and is in agreement with plan.   Final Clinical Impression(s) / ED Diagnoses Final diagnoses:  Acute bilateral low back  pain with bilateral sciatica    Rx / DC Orders ED Discharge Orders         Ordered    methocarbamol (ROBAXIN) 500 MG tablet  2 times daily        10/12/20 1704    naproxen (NAPROSYN) 500 MG tablet  2 times daily        10/12/20 1704    predniSONE (DELTASONE) 20 MG tablet  Daily        10/12/20 1704    oxyCODONE-acetaminophen (PERCOCET) 5-325 MG tablet  Every 6 hours PRN        10/12/20 1704           Dartha Lodge, PA-C 10/12/20 2255    Bethann Berkshire, MD 10/17/20 1031

## 2020-10-12 NOTE — ED Triage Notes (Signed)
Pt reports lower back pain flare up. Pt reports history of degenerative disc disease. Pt reports flare up started yesterday. Pt denies any recent injury. Pt reports history of same. Ems administered of fentanyl IM en route. Pt reports "its starting to work". Pt reports continued back spasms. Pt using walker to get in EMS stretcher.

## 2020-10-15 DIAGNOSIS — M5440 Lumbago with sciatica, unspecified side: Secondary | ICD-10-CM | POA: Diagnosis not present

## 2020-10-15 DIAGNOSIS — M5136 Other intervertebral disc degeneration, lumbar region: Secondary | ICD-10-CM | POA: Diagnosis not present

## 2020-10-22 ENCOUNTER — Other Ambulatory Visit: Payer: Self-pay

## 2020-10-22 ENCOUNTER — Encounter: Payer: Self-pay | Admitting: Family Medicine

## 2020-10-22 ENCOUNTER — Ambulatory Visit (INDEPENDENT_AMBULATORY_CARE_PROVIDER_SITE_OTHER): Payer: 59 | Admitting: Family Medicine

## 2020-10-22 VITALS — BP 128/84 | HR 114 | Temp 97.6°F | Ht 75.0 in | Wt 394.0 lb

## 2020-10-22 DIAGNOSIS — M5126 Other intervertebral disc displacement, lumbar region: Secondary | ICD-10-CM

## 2020-10-22 MED ORDER — METHOCARBAMOL 500 MG PO TABS: 500.0000 mg | ORAL_TABLET | Freq: Two times a day (BID) | ORAL | 0 refills | Status: DC

## 2020-10-22 NOTE — Progress Notes (Signed)
Patient ID: Randy Navarro, male    DOB: May 27, 1981, 40 y.o.   MRN: [id number]   Chief Complaint  Patient presents with  . Back Pain   Subjective:    HPI Pt went to ED on 10/12/20 for back pain. EMS transported pt to ED. Pt has DDD and was unable to walk. Pt was prescribed Prednisone, Robaxin, naproxen and oxycodone. Pt would like refills on Robaxin. Neuro has given refills on pain med. Back pain is 4-6/10. Taking muscle relaxed q 12 hours.   Pt has h/o DDD and herniated disc in lumbar spine.  Getting flare ups.  Pt seen a specialist in past.  Went last week and saw specialist.  thye are going to order a new MRI lumbar.  Last one 2020.  They gave some pain medications.  MRI- lumbar 9/20- IMPRESSION: 1. No stenosis or impingement. 2. Interval decrease in size of the right paracentral disc protrusion at L4-L5. 3. Unchanged small disc protrusions at T11-T12 and T12-L1.  Neurosurgeon- [facility]. Refilled oxycodone medications and f/u 1 mo.  First started in 2005.  Had a job with lots of heavy lifiting at that time. Was on oral steroids in past. Pt is going to work on eating better.   Hasn't drove since last visit. Did drive yesterday.    Medical History Veryl has a past medical history of ADHD (attention deficit hyperactivity disorder), Asthma, Blood pressure elevated without history of HTN, IBS (irritable bowel syndrome), and Pneumonia (age 54 year).   Outpatient Encounter Medications as of 10/22/2020  Medication Sig  . albuterol (VENTOLIN HFA) 108 (90 Base) MCG/ACT inhaler Inhale 2 puffs into the lungs every 6 (six) hours as needed for wheezing or shortness of breath.  Haywood Pao HFA 44 MCG/ACT inhaler TAKE 2 PUFFS BY MOUTH TWICE A DAY  . naproxen (NAPROSYN) 500 MG tablet Take 1 tablet (500 mg total) by mouth 2 (two) times daily.  Marland Kitchen oxyCODONE-acetaminophen (PERCOCET) 5-325 MG tablet Take 1 tablet by mouth every 6 (six) hours as needed.  . [DISCONTINUED]  methocarbamol (ROBAXIN) 500 MG tablet Take 1 tablet (500 mg total) by mouth 2 (two) times daily.  . methocarbamol (ROBAXIN) 500 MG tablet Take 1 tablet (500 mg total) by mouth 2 (two) times daily.   No facility-administered encounter medications on file as of 10/22/2020.     Review of Systems  Constitutional: Negative for chills and fever.  HENT: Negative for congestion, rhinorrhea and sore throat.   Respiratory: Negative for cough, shortness of breath and wheezing.   Cardiovascular: Negative for chest pain and leg swelling.  Gastrointestinal: Negative for abdominal pain, diarrhea, nausea and vomiting.  Genitourinary: Negative for dysuria and frequency.  Musculoskeletal: Positive for back pain.  Skin: Negative for rash.  Neurological: Negative for dizziness, weakness and headaches.     Vitals BP 128/84   Pulse (!) 114   Temp 97.6 F (36.4 C)   Ht 6\' 3"  (1.905 m)   Wt (!) 394 lb (178.7 kg)   SpO2 98%   BMI 49.25 kg/m   Objective:   Physical Exam Vitals and nursing note reviewed.  Constitutional:      General: He is not in acute distress.    Appearance: Normal appearance. He is obese. He is not ill-appearing.  Cardiovascular:     Rate and Rhythm: Normal rate and regular rhythm.     Pulses: Normal pulses.     Heart sounds: Normal heart sounds.  Pulmonary:  Effort: Pulmonary effort is normal. No respiratory distress.     Breath sounds: Normal breath sounds.  Musculoskeletal:     Comments: +lumbar pain. Dec rom with flexion of back.  Skin:    General: Skin is warm and dry.     Findings: No rash.  Neurological:     General: No focal deficit present.     Mental Status: He is alert and oriented to person, place, and time.     Cranial Nerves: No cranial nerve deficit.     Motor: No weakness.     Gait: Gait abnormal.     Comments: +walking with 2 canes.  Psychiatric:        Mood and Affect: Mood normal.        Behavior: Behavior normal.        Thought Content:  Thought content normal.        Judgment: Judgment normal.      Assessment and Plan   1. Herniated lumbar intervertebral disc - methocarbamol (ROBAXIN) 500 MG tablet; Take 1 tablet (500 mg total) by mouth 2 (two) times daily.  Dispense: 30 tablet; Refill: 0    Pt has labs in computer, hasn't had them since 2017.  Multiple active labs in computer and never obtained them.  Advising pt can get them today after this visit or in the next week.   Pt seen by surgeon, needing to f/u with them in 1 mo for recheck.  F/u with surgeon for papers for FMLA for his back pain.  Reviewed that we couldn't fill out FMLA papers for his wife for his back pain and work up, until he is seen by Careers adviser and the come up with a plan, PT, injections, surgery etc.  Pt given robaxin and take naproxen bid. Heat/ice prn.   F/u prn.

## 2020-11-02 ENCOUNTER — Other Ambulatory Visit: Payer: Self-pay | Admitting: Family Medicine

## 2020-11-02 ENCOUNTER — Encounter: Payer: Self-pay | Admitting: Family Medicine

## 2020-11-25 DIAGNOSIS — M5136 Other intervertebral disc degeneration, lumbar region: Secondary | ICD-10-CM | POA: Diagnosis not present

## 2020-11-25 DIAGNOSIS — M5440 Lumbago with sciatica, unspecified side: Secondary | ICD-10-CM | POA: Diagnosis not present

## 2020-12-03 ENCOUNTER — Other Ambulatory Visit: Payer: Self-pay | Admitting: Family Medicine

## 2020-12-23 ENCOUNTER — Other Ambulatory Visit: Payer: Self-pay | Admitting: Neurosurgery

## 2020-12-23 DIAGNOSIS — M5440 Lumbago with sciatica, unspecified side: Secondary | ICD-10-CM

## 2020-12-25 ENCOUNTER — Other Ambulatory Visit: Payer: Self-pay

## 2020-12-25 ENCOUNTER — Ambulatory Visit
Admission: RE | Admit: 2020-12-25 | Discharge: 2020-12-25 | Disposition: A | Discharge status: Home / Self Care | Payer: 59 | Source: Ambulatory Visit | Attending: Neurosurgery | Admitting: Neurosurgery

## 2020-12-25 DIAGNOSIS — M5126 Other intervertebral disc displacement, lumbar region: Secondary | ICD-10-CM | POA: Diagnosis not present

## 2020-12-25 DIAGNOSIS — M47816 Spondylosis without myelopathy or radiculopathy, lumbar region: Secondary | ICD-10-CM | POA: Diagnosis not present

## 2020-12-25 DIAGNOSIS — M5440 Lumbago with sciatica, unspecified side: Secondary | ICD-10-CM

## 2020-12-26 DIAGNOSIS — R030 Elevated blood-pressure reading, without diagnosis of hypertension: Secondary | ICD-10-CM | POA: Diagnosis not present

## 2020-12-26 DIAGNOSIS — M5136 Other intervertebral disc degeneration, lumbar region: Secondary | ICD-10-CM | POA: Diagnosis not present

## 2020-12-26 DIAGNOSIS — Z6841 Body Mass Index (BMI) 40.0 and over, adult: Secondary | ICD-10-CM | POA: Diagnosis not present

## 2020-12-26 DIAGNOSIS — M5440 Lumbago with sciatica, unspecified side: Secondary | ICD-10-CM | POA: Diagnosis not present

## 2020-12-27 ENCOUNTER — Ambulatory Visit
Admission: EM | Admit: 2020-12-27 | Discharge: 2020-12-27 | Disposition: A | Discharge status: Home / Self Care | Payer: 59 | Attending: Emergency Medicine | Admitting: Emergency Medicine

## 2020-12-27 ENCOUNTER — Other Ambulatory Visit: Payer: Self-pay

## 2020-12-27 DIAGNOSIS — R030 Elevated blood-pressure reading, without diagnosis of hypertension: Secondary | ICD-10-CM

## 2020-12-27 MED ORDER — LISINOPRIL 10 MG PO TABS: 10.0000 mg | ORAL_TABLET | Freq: Every day | ORAL | 0 refills | Status: DC

## 2020-12-27 NOTE — Discharge Instructions (Signed)
Lisinopril prescribed.  Take as directed Please continue to monitor blood pressure at home and keep a log Eat a well balanced diet of fruits, vegetables and lean meats.  Avoid foods high in fat and salt Drink water.  At least half your body weight in ounces Exercise for at least 30 minutes daily Return or go to the ED if you have any new or worsening symptoms such as vision changes, fatigue, dizziness, chest pain, shortness of breath, nausea, swelling in your hands or feet, urinary symptoms, etc..Marland Kitchen

## 2020-12-27 NOTE — ED Provider Notes (Signed)
[facility] CARE [facility]ENTER   [id number] 12/27/20 Arrival Time: 1443  CC: Elevated blood pressure  SUBJECTIVE:  Randy Navarro is a 40 y.o. male who presents for elevated blood pressure over the past day.  Denies hx of HTN.  Blood pressure at neurology appt 160/102, and at home 180/110.  157/101 in office.  Does not take blood pressure medication.  Does have a PCP unable to be seen by PCP today.  Denies HA, vision changes, dizziness, lightheadedness, chest pain, shortness of breath, numbness or tingling in extremities, abdominal pain, changes in bowel or bladder habits.     ROS: As per HPI.  All other pertinent ROS negative.     Past Medical History:  Diagnosis Date  . ADHD (attention deficit hyperactivity disorder)   . Asthma   . Blood pressure elevated without history of HTN   . IBS (irritable bowel syndrome)   . Pneumonia age 27 year   History reviewed. No pertinent surgical history. No Known Allergies No current facility-administered medications on file prior to encounter.   Current Outpatient Medications on File Prior to Encounter  Medication Sig Dispense Refill  . albuterol (VENTOLIN HFA) 108 (90 Base) MCG/ACT inhaler Inhale 2 puffs into the lungs every 6 (six) hours as needed for wheezing or shortness of breath. 18 g 2  . FLOVENT HFA 44 MCG/ACT inhaler TAKE 2 PUFFS BY MOUTH TWICE A DAY 10.6 Inhaler 0  . methocarbamol (ROBAXIN) 500 MG tablet TAKE 1 TABLET BY MOUTH TWICE A DAY 60 tablet 0  . naproxen (NAPROSYN) 500 MG tablet Take 1 tablet (500 mg total) by mouth 2 (two) times daily. 30 tablet 0  . oxyCODONE-acetaminophen (PERCOCET) 5-325 MG tablet Take 1 tablet by mouth every 6 (six) hours as needed. 10 tablet 0   Social History   Socioeconomic History  . Marital status: Single    Spouse name: Not on file  . Number of children: Not on file  . Years of education: Not on file  . Highest education level: Not on file  Occupational History  . Not on file  Tobacco Use  . Smoking  status: Light Tobacco Smoker    Packs/day: 0.10    Types: Cigarettes  . Smokeless tobacco: Current User    Types: Chew  . Tobacco comment:  about 20 yrs.  Substance and Sexual Activity  . Alcohol use: Yes    Alcohol/week: 0.0 standard drinks    Comment: 4 beers per month  . Drug use: Never  . Sexual activity: Not on file  Other Topics Concern  . Not on file  Social History Narrative  . Not on file   Social Determinants of Health   Financial Resource Strain: Not on file  Food Insecurity: Not on file  Transportation Needs: Not on file  Physical Activity: Not on file  Stress: Not on file  Social Connections: Not on file  Intimate Partner Violence: Not on file   Family History  Family history unknown: Yes    OBJECTIVE:  Vitals:   12/27/20 1503  BP: (!) 157/101  Pulse: 92  Resp: 18  Temp: 98 F (36.7 C)  SpO2: 97%    General appearance: alert; no distress Eyes: PERRLA; EOMI HENT: normocephalic; atraumatic Neck: supple with FROM Lungs: clear to auscultation bilaterally Heart: regular rate and rhythm.  Extremities: no edema; symmetrical with no gross deformities Skin: warm and dry Neurologic: CN 2-12 grossly intact; finger to nose without difficulty; normal gait; strength and sensation intact bilaterally about the upper  and lower extremities; negative pronator drift Psychological: alert and cooperative; normal mood and affect   ASSESSMENT & PLAN:  1. Elevated blood pressure reading     Meds ordered this encounter  Medications  . lisinopril (ZESTRIL) 10 MG tablet    Sig: Take 1 tablet (10 mg total) by mouth daily.    Dispense:  30 tablet    Refill:  0    Order Specific Question:   Supervising Provider    Answer:   Eustace Moore [[id number]]   Lisinopril prescribed.  Take as directed Please continue to monitor blood pressure at home and keep a log Eat a well balanced diet of fruits, vegetables and lean meats.  Avoid foods high in fat and salt Drink  water.  At least half your body weight in ounces Exercise for at least 30 minutes daily Return or go to the ED if you have any new or worsening symptoms such as vision changes, fatigue, dizziness, chest pain, shortness of breath, nausea, swelling in your hands or feet, urinary symptoms, etc...  Reviewed expectations re: course of current medical issues. Questions answered. Outlined signs and symptoms indicating need for more acute intervention. Patient verbalized understanding. After Visit Summary given.   Rennis Harding, PA-C 12/27/20 1857

## 2020-12-27 NOTE — ED Triage Notes (Signed)
Pt states he recently learned that he had elevated BP after visit to neurologist for back, was advised to be seen, pt unable to get in with pcp for next couple weeks

## 2021-01-07 ENCOUNTER — Encounter: Payer: Self-pay | Admitting: Family Medicine

## 2021-01-07 ENCOUNTER — Other Ambulatory Visit: Payer: Self-pay

## 2021-01-07 ENCOUNTER — Ambulatory Visit (INDEPENDENT_AMBULATORY_CARE_PROVIDER_SITE_OTHER): Payer: 59 | Admitting: Family Medicine

## 2021-01-07 VITALS — BP 140/92 | HR 82 | Temp 98.6°F | Ht 75.0 in | Wt 394.0 lb

## 2021-01-07 DIAGNOSIS — M5126 Other intervertebral disc displacement, lumbar region: Secondary | ICD-10-CM

## 2021-01-07 DIAGNOSIS — I10 Essential (primary) hypertension: Secondary | ICD-10-CM

## 2021-01-07 MED ORDER — LISINOPRIL 20 MG PO TABS: 20.0000 mg | ORAL_TABLET | Freq: Every day | ORAL | 2 refills | Status: DC

## 2021-01-07 NOTE — Progress Notes (Signed)
Patient ID: Randy Navarro, male    DOB: 13-May-1981, 40 y.o.   MRN: [id number]   Chief Complaint  Patient presents with  . Hypertension   Subjective:    HPI htn follow up. bp has been running high.   Went to urgent care had 180/111, then at urgent care had 157/101. Started lisinopril 10mg .  Then 3 hrs later came down to 168/107.  Next day seeing 140-150s/86-97.   Staying around 150-10/ 90-110.   Not having any chest pain, headaches, leg swelling, or dizziness.  Pt was on prednisone at ER in January.  regularly taking 500mg  naproxen 1-2x per day. Last 2 wks stopped taking it.  Pt has gained weight some over last few months with chronic back pain.  Mri of lumbar- neurosurgeon- nothing needing to do surgery or intervention at this time. Pt wanting to see about a second opinion. Has small central disc protrusion at L4-L5, that has interval increase since last MRI. W/o significant spinal cord stenosis. Not other stenosis. Seeing chiropractor.  Might get with PT with them.  Medical History Randy Navarro has a past medical history of ADHD (attention deficit hyperactivity disorder), Asthma, Blood pressure elevated without history of HTN, IBS (irritable bowel syndrome), and Pneumonia (age 83 year).   Outpatient Encounter Medications as of 01/07/2021  Medication Sig  . albuterol (VENTOLIN HFA) 108 (90 Base) MCG/ACT inhaler Inhale 2 puffs into the lungs every 6 (six) hours as needed for wheezing or shortness of breath.  Randy Navarro HFA 44 MCG/ACT inhaler TAKE 2 PUFFS BY MOUTH TWICE A DAY  . methocarbamol (ROBAXIN) 500 MG tablet TAKE 1 TABLET BY MOUTH TWICE A DAY  . oxyCODONE-acetaminophen (PERCOCET) 5-325 MG tablet Take 1 tablet by mouth every 6 (six) hours as needed.  . [DISCONTINUED] lisinopril (ZESTRIL) 10 MG tablet Take 1 tablet (10 mg total) by mouth daily.  . [DISCONTINUED] naproxen (NAPROSYN) 500 MG tablet Take 1 tablet (500 mg total) by mouth 2 (two) times daily.  Marland Kitchen lisinopril  (ZESTRIL) 20 MG tablet Take 1 tablet (20 mg total) by mouth daily.   No facility-administered encounter medications on file as of 01/07/2021.     Review of Systems  Constitutional: Negative for chills and fever.  HENT: Negative for congestion, rhinorrhea and sore throat.   Respiratory: Negative for cough, shortness of breath and wheezing.   Cardiovascular: Negative for chest pain and leg swelling.  Gastrointestinal: Negative for abdominal pain, diarrhea, nausea and vomiting.  Genitourinary: Negative for dysuria and frequency.  Musculoskeletal: Positive for back pain (chronic).  Skin: Negative for rash.  Neurological: Negative for dizziness, weakness and headaches.     Vitals BP (!) 140/92   Pulse 82   Temp 98.6 F (37 C)   Ht 6\' 3"  (1.905 m)   Wt (!) 394 lb (178.7 kg)   SpO2 99%   BMI 49.25 kg/m   Objective:   Physical Exam Vitals and nursing note reviewed.  Constitutional:      General: He is not in acute distress.    Appearance: Normal appearance. He is not ill-appearing.  HENT:     Head: Normocephalic.     Nose: Nose normal. No congestion.     Mouth/Throat:     Mouth: Mucous membranes are moist.     Pharynx: No oropharyngeal exudate.  Eyes:     Extraocular Movements: Extraocular movements intact.     Conjunctiva/sclera: Conjunctivae normal.     Pupils: Pupils are equal, round, and reactive to light.  Cardiovascular:  Rate and Rhythm: Normal rate and regular rhythm.     Pulses: Normal pulses.     Heart sounds: Normal heart sounds. No murmur heard.   Pulmonary:     Effort: Pulmonary effort is normal.     Breath sounds: Normal breath sounds. No wheezing, rhonchi or rales.  Musculoskeletal:        General: Normal range of motion.     Right lower leg: No edema.     Left lower leg: No edema.  Skin:    General: Skin is warm and dry.     Findings: No rash.  Neurological:     General: No focal deficit present.     Mental Status: He is alert and oriented to  person, place, and time.     Cranial Nerves: No cranial nerve deficit.     Comments: Using a cane  Psychiatric:        Mood and Affect: Mood normal.        Behavior: Behavior normal.        Thought Content: Thought content normal.        Judgment: Judgment normal.     Assessment and Plan   1. Essential hypertension - CBC - CMP14+EGFR - Lipid panel  2. Morbid obesity ([place])  3. Herniated lumbar intervertebral disc   htn- suboptimal.  Inc lisinopril from $RemoveBeforeD'10mg'oPdcklszIfUMfZ$  to $R'20mg'jI$ .   Obesity- Discussed diet modifications and exercising and dec calories and salt in diet.  Eating 1500-1600 calories per day.  Dec carbs in diet.   Check bp 2 x per day and call if seeing low or high numbers.  handout given.  Labs ordered today. Pt to get them today. Will call with results.  Herniated disc and lumbar pain- pt to cont with f/u with neurosurgeon and chiropractor.  Pt to cont with starting PT at chiropractor's office.  Return in about 2 months (around 03/09/2021) for f/u bp.  01/07/2021

## 2021-01-07 NOTE — Patient Instructions (Signed)
Check bp numbers 2 x per day.  1 in am and 1 in pm after having medications in for at least 2 hrs.   Call if seeing numbers under 100/70 or over 145/90.   Start new dose lisinopril 20mg  daily.

## 2021-01-08 DIAGNOSIS — M5459 Other low back pain: Secondary | ICD-10-CM | POA: Diagnosis not present

## 2021-01-08 DIAGNOSIS — Z6841 Body Mass Index (BMI) 40.0 and over, adult: Secondary | ICD-10-CM | POA: Diagnosis not present

## 2021-01-08 LAB — CMP14+EGFR
ALT: 31 IU/L (ref 0–44)
AST: 21 IU/L (ref 0–40)
Albumin/Globulin Ratio: 1.3 (ref 1.2–2.2)
Albumin: 4.2 g/dL (ref 4.0–5.0)
Alkaline Phosphatase: 99 IU/L (ref 44–121)
BUN/Creatinine Ratio: 9 (ref 9–20)
BUN: 11 mg/dL (ref 6–20)
Bilirubin Total: 0.3 mg/dL (ref 0.0–1.2)
CO2: 25 mmol/L (ref 20–29)
Calcium: 9.5 mg/dL (ref 8.7–10.2)
Chloride: 100 mmol/L (ref 96–106)
Creatinine, Ser: 1.27 mg/dL (ref 0.76–1.27)
Globulin, Total: 3.2 g/dL (ref 1.5–4.5)
Glucose: 101 mg/dL — ABNORMAL HIGH (ref 65–99)
Potassium: 4.6 mmol/L (ref 3.5–5.2)
Sodium: 140 mmol/L (ref 134–144)
Total Protein: 7.4 g/dL (ref 6.0–8.5)
eGFR: 74 mL/min/{1.73_m2} (ref >59)

## 2021-01-08 LAB — CBC
Hematocrit: 45 % (ref 37.5–51.0)
Hemoglobin: 15.1 g/dL (ref 13.0–17.7)
MCH: 28.5 pg (ref 26.6–33.0)
MCHC: 33.6 g/dL (ref 31.5–35.7)
MCV: 85 fL (ref 79–97)
Platelets: 301 10*3/uL (ref 150–450)
RBC: 5.29 x10E6/uL (ref 4.14–5.80)
RDW: 13.7 % (ref 11.6–15.4)
WBC: 8.6 10*3/uL (ref 3.4–10.8)

## 2021-01-08 LAB — LIPID PANEL
Chol/HDL Ratio: 5 ratio (ref 0.0–5.0)
Cholesterol, Total: 149 mg/dL (ref 100–199)
HDL: 30 mg/dL — ABNORMAL LOW (ref >39)
LDL Chol Calc (NIH): 92 mg/dL (ref 0–99)
Triglycerides: 155 mg/dL — ABNORMAL HIGH (ref 0–149)
VLDL Cholesterol Cal: 27 mg/dL (ref 5–40)

## 2021-01-09 ENCOUNTER — Other Ambulatory Visit: Payer: Self-pay | Admitting: Family Medicine

## 2021-01-09 MED ORDER — FLOVENT HFA 44 MCG/ACT IN AERO: INHALATION_SPRAY | RESPIRATORY_TRACT | 5 refills | Status: DC

## 2021-01-09 NOTE — Telephone Encounter (Signed)
Patient needs his flovent inhaler sent to [facility] instead [facility].

## 2021-01-10 ENCOUNTER — Other Ambulatory Visit: Payer: Self-pay | Admitting: Family Medicine

## 2021-01-10 NOTE — Telephone Encounter (Signed)
Med check up 3/29. Not sure if you want to continue muscle relaxer

## 2021-01-11 ENCOUNTER — Other Ambulatory Visit (HOSPITAL_COMMUNITY): Payer: Self-pay

## 2021-01-11 MED FILL — Fluticasone Propionate HFA Inhal Aero 44 MCG/ACT: RESPIRATORY_TRACT | 30 days supply | Qty: 10.6 | Fill #0 | Status: CN

## 2021-01-23 ENCOUNTER — Other Ambulatory Visit (HOSPITAL_COMMUNITY): Payer: Self-pay

## 2021-02-10 ENCOUNTER — Telehealth: Payer: Self-pay

## 2021-02-10 NOTE — Telephone Encounter (Signed)
Please advise. Thank you

## 2021-02-10 NOTE — Telephone Encounter (Signed)
Last message to return call

## 2021-02-10 NOTE — Telephone Encounter (Signed)
In the drug interaction checker, showing a moderate interaction and may lower your blood pressure.  So would recommend taking them 4-6 hrs apart if possible.   If dizzy, lightheaded, then avoid operating heavy machinery.   Let us know how he's feeling.   Thx.   Dr. Ladona Ridgel

## 2021-02-10 NOTE — Telephone Encounter (Signed)
Pt wants to know if it is ok to take lisinopril (ZESTRIL) 20 MG tablet  And the methocarbamol (ROBAXIN) 500 MG tablet together.  Call back [phone]

## 2021-02-11 ENCOUNTER — Other Ambulatory Visit: Payer: Self-pay

## 2021-02-11 ENCOUNTER — Encounter (INDEPENDENT_AMBULATORY_CARE_PROVIDER_SITE_OTHER): Payer: Self-pay | Admitting: Family Medicine

## 2021-02-11 ENCOUNTER — Ambulatory Visit (INDEPENDENT_AMBULATORY_CARE_PROVIDER_SITE_OTHER): Payer: 59 | Admitting: Family Medicine

## 2021-02-11 VITALS — BP 115/75 | HR 81 | Temp 98.6°F | Ht 75.0 in | Wt 376.0 lb

## 2021-02-11 DIAGNOSIS — I10 Essential (primary) hypertension: Secondary | ICD-10-CM | POA: Diagnosis not present

## 2021-02-11 DIAGNOSIS — E559 Vitamin D deficiency, unspecified: Secondary | ICD-10-CM | POA: Diagnosis not present

## 2021-02-11 DIAGNOSIS — Z9189 Other specified personal risk factors, not elsewhere classified: Secondary | ICD-10-CM | POA: Diagnosis not present

## 2021-02-11 DIAGNOSIS — R748 Abnormal levels of other serum enzymes: Secondary | ICD-10-CM | POA: Diagnosis not present

## 2021-02-11 DIAGNOSIS — R0602 Shortness of breath: Secondary | ICD-10-CM

## 2021-02-11 DIAGNOSIS — Z1331 Encounter for screening for depression: Secondary | ICD-10-CM | POA: Diagnosis not present

## 2021-02-11 DIAGNOSIS — R5383 Other fatigue: Secondary | ICD-10-CM

## 2021-02-11 DIAGNOSIS — R739 Hyperglycemia, unspecified: Secondary | ICD-10-CM | POA: Insufficient documentation

## 2021-02-11 DIAGNOSIS — Z0289 Encounter for other administrative examinations: Secondary | ICD-10-CM

## 2021-02-11 DIAGNOSIS — Z6841 Body Mass Index (BMI) 40.0 and over, adult: Secondary | ICD-10-CM

## 2021-02-11 DIAGNOSIS — K219 Gastro-esophageal reflux disease without esophagitis: Secondary | ICD-10-CM | POA: Insufficient documentation

## 2021-02-11 DIAGNOSIS — E66813 Obesity, class 3: Secondary | ICD-10-CM

## 2021-02-12 LAB — VITAMIN D 25 HYDROXY (VIT D DEFICIENCY, FRACTURES): Vit D, 25-Hydroxy: 11.4 ng/mL — ABNORMAL LOW (ref 30.0–100.0)

## 2021-02-12 LAB — HEMOGLOBIN A1C
Est. average glucose Bld gHb Est-mCnc: 120 mg/dL
Hgb A1c MFr Bld: 5.8 % — ABNORMAL HIGH (ref 4.8–5.6)

## 2021-02-12 LAB — FOLATE: Folate: 7.3 ng/mL (ref >3.0)

## 2021-02-12 LAB — T4, FREE: Free T4: 1.16 ng/dL (ref 0.82–1.77)

## 2021-02-12 LAB — INSULIN, RANDOM: INSULIN: 20.4 u[IU]/mL (ref 2.6–24.9)

## 2021-02-12 LAB — VITAMIN B12: Vitamin B-12: 251 pg/mL (ref 232–1245)

## 2021-02-12 LAB — TSH: TSH: 1.42 u[IU]/mL (ref 0.450–4.500)

## 2021-02-12 LAB — T3: T3, Total: 132 ng/dL (ref 71–180)

## 2021-02-13 DIAGNOSIS — M5416 Radiculopathy, lumbar region: Secondary | ICD-10-CM | POA: Diagnosis not present

## 2021-02-19 ENCOUNTER — Other Ambulatory Visit: Payer: Self-pay | Admitting: Family Medicine

## 2021-02-19 NOTE — Telephone Encounter (Signed)
Sent mychart message

## 2021-02-19 NOTE — Progress Notes (Signed)
Dear Randy FolksAmanda Ward, PA-C,   Thank you for referring Randy Navarro to our clinic. The following note includes my evaluation and treatment recommendations.  Chief Complaint:   OBESITY Randy BarrowsBryan Navarro (MR# [id number]) is a 40 y.o. male who presents for evaluation and treatment of obesity and related comorbidities. Current BMI is Body mass index is 47 kg/m. Randy Navarro has been struggling with his weight for many years and has been unsuccessful in either losing weight, maintaining weight loss, or reaching his healthy weight goal.  Randy Navarro is currently in the action stage of change and ready to dedicate time achieving and maintaining a healthier weight. Randy Navarro is interested in becoming our patient and working on intensive lifestyle modifications including (but not limited to) diet and exercise for weight loss.  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  He lives with his wife, Randy Navarro, and their 1472-month-old child.  Denies cravings.  Snacks on bananas, chips, cookies, and nabs.  Skips lunch and dinner 2-3 times per week.  Desires to be 250 pounds as quickly as possible.  Worst habit is eating late at night (snacking) and too large portions.  He was sent to us by Randy Navarro.  Randy Navarro habits were reviewed today and are as follows: His family eats meals together, he thinks his family will eat healthier with him, his desired weight loss is 126 pounds, he started gaining weight after high school, his heaviest weight ever was 404 pounds, he is a picky eater and doesn't like to eat healthier foods, he snacks frequently in the evenings, he skips lunch and dinner 2-3 times per week, he frequently eats larger portions than normal and he struggles with emotional eating.  Depression Screen Crit's Food and Mood (modified PHQ-9) score was 4.  Depression screen PHQ 2/9 02/11/2021  Decreased Interest 1  Down, Depressed, Hopeless 0  PHQ - 2 Score 1  Altered sleeping 1  Tired, decreased energy 1  Change in  appetite 0  Feeling bad or failure about yourself  0  Trouble concentrating 0  Moving slowly or fidgety/restless 1  Suicidal thoughts 0  PHQ-9 Score 4  Difficult doing work/chores Somewhat difficult   Assessment/Plan:   Orders Placed This Encounter  Procedures  . Vitamin B12  . Folate  . Hemoglobin A1c  . Insulin, random  . T3  . T4, free  . TSH  . VITAMIN D 25 Hydroxy (Vit-D Deficiency, Fractures)  . EKG 12-Lead   1. Other fatigue Randy Navarro admits to daytime somnolence and reports waking up still tired. Patent has a history of symptoms of daytime fatigue, morning fatigue and snoring. Randy Navarro generally gets 5 or 6 hours of sleep per night, and states that he has generally restful sleep. Snoring is present. Apneic episodes are present. Epworth Sleepiness Score is 6.  Randy Navarro does feel that his weight is causing his energy to be lower than it should be. Fatigue may be related to obesity, depression or many other causes. Labs will be ordered, and in the meanwhile, Randy Navarro will focus on self care including making healthy food choices, increasing physical activity and focusing on stress reduction.  Will check EKG and labs today.  - EKG 12-Lead - Vitamin B12 - Folate - T3 - T4, free - TSH  2. SOBOE (shortness of breath on exertion) Randy Navarro notes increasing shortness of breath with exercising and seems to be worsening over time with weight gain. He notes getting out of breath sooner with activity than he used to.  This has gotten worse recently. Randy Navarro denies shortness of breath at rest or orthopnea.  Randy Navarro does feel that he gets out of breath more easily that he used to when he exercises. Randy Navarro's shortness of breath appears to be obesity related and exercise induced. He has agreed to work on weight loss and gradually increase exercise to treat his exercise induced shortness of breath. Will continue to monitor closely.  Will check IC and labs today.  - Vitamin B12 - Folate - T3 - T4, free -  TSH  3. Essential hypertension At goal. Medications: lisinopril 20 mg daily.   Plan:  At goal.  Continue lisinopril.  Avoid buying foods that are: processed, frozen, or prepackaged to avoid excess salt.  Increase water intake. We will watch for signs of hypotension as he continues lifestyle modifications. We will continue to monitor closely alongside his PCP and/or Specialist.  Regular follow up with PCP and specialists was also encouraged.   BP Readings from Last 3 Encounters:  02/11/21 115/75  01/07/21 (!) 140/92  12/27/20 (!) 157/101   Lab Results  Component Value Date   CREATININE 1.27 01/07/2021   4. Low serum HDL Course: Not at goal. Lipid-lowering medications: None.  Elevated TGs and HDL at 30.  Plan:  Start prudent nutritional plan.  Decrease simple carbs and eventually add exercise.  Dietary changes: Increase soluble fiber, decrease simple carbohydrates, decrease saturated fat. Exercise changes: Moderate to vigorous-intensity aerobic activity 150 minutes per week or as tolerated. We will continue to monitor along with PCP/specialists as it pertains to his weight loss journey.  Lab Results  Component Value Date   CHOL 149 01/07/2021   HDL 30 (L) 01/07/2021   LDLCALC 92 01/07/2021   TRIG 155 (H) 01/07/2021   CHOLHDL 5.0 01/07/2021   Lab Results  Component Value Date   ALT 31 01/07/2021   AST 21 01/07/2021   ALKPHOS 99 01/07/2021   BILITOT 0.3 01/07/2021   5. Hyperglycemia Elevated FBS in the past on lab check.  Plan:  Will check FI and A1c today.  Follow prudent nutritional plan and weight loss. - Hemoglobin A1c - Insulin, random  6. Vitamin D deficiency Randy Navarro is taking OTC vitamin D but is unsure of the dose.  Optimal goal > 50 ng/dL.   Plan: Continue current OTC vitamin D supplementation.  Will check vitamin D level today, as per below.  - VITAMIN D 25 Hydroxy (Vit-D Deficiency, Fractures)  7. Depression screening Randy Navarro was screened for depression as part  of her new patient visit today.  PHQ-9 is 4.  Depression screen is negative.  8. At risk for heart disease Due to Randy Navarro's current state of health and medical condition(s), such as a visceral fat rating of 25, he is at a higher risk for heart disease.  This puts the patient at much greater risk to subsequently develop cardiopulmonary conditions that can significantly affect patient's quality of life in a negative manner.    At least 23 minutes were spent on counseling Randy Navarro about these concerns today, and I stressed the importance of reversing risks factors of obesity, especially truncal and visceral fat, hypertension, hyperlipidemia, and pre-diabetes.  The initial goal is to lose at least 5-10% of starting weight to help reduce these risk factors.  Counseling:  Intensive lifestyle modifications were discussed with Randy Navarro as the most appropriate first line of treatment.  he will continue to work on diet, exercise, and weight loss efforts.  We will continue to reassess these  conditions on a fairly regular basis in an attempt to decrease the patient's overall morbidity and mortality.  Evidence-based interventions for health behavior change were utilized today including the discussion of self monitoring techniques, problem-solving barriers, and SMART goal setting techniques.  Specifically, regarding patient's less desirable eating habits and patterns, we employed the technique of small changes when Randy Navarro has not been able to fully commit to his prudent nutritional plan.  9. Class 3 severe obesity with serious comorbidity and body mass index (BMI) of 45.0 to 49.9 in adult, unspecified obesity type (HCC)  Randy Navarro is currently in the action stage of change and his goal is to continue with weight loss efforts. I recommend Randy Navarro begin the structured treatment plan as follows:  He has agreed to the Category 4 Plan.  Exercise goals: As is.   Behavioral modification strategies: increasing lean protein intake,  decreasing simple carbohydrates, no skipping meals, meal planning and cooking strategies, keeping healthy foods in the home and planning for success.  He was informed of the importance of frequent follow-up visits to maximize his success with intensive lifestyle modifications for his multiple health conditions. He was informed we would discuss his lab results at his next visit unless there is a critical issue that needs to be addressed sooner. Randy Navarro agreed to keep his next visit at the agreed upon time to discuss these results.  Objective:   Blood pressure 115/75, pulse 81, temperature 98.6 F (37 C), height 6\' 3"  (1.905 m), weight (!) 376 lb (170.6 kg), SpO2 97 %. Body mass index is 47 kg/m.  EKG: Normal sinus rhythm, rate 81 bpm.  Indirect Calorimeter completed today shows a VO2 of 392 and a REE of 2730.  His calculated basal metabolic rate is thus his basal metabolic rate is worse than expected.  General: Cooperative, alert, well developed, in no acute distress. HEENT: Conjunctivae and lids unremarkable. Cardiovascular: Regular rhythm.  Lungs: Normal work of breathing. Neurologic: No focal deficits.   Lab Results  Component Value Date   CREATININE 1.27 01/07/2021   BUN 11 01/07/2021   NA 140 01/07/2021   K 4.6 01/07/2021   CL 100 01/07/2021   CO2 25 01/07/2021   Lab Results  Component Value Date   ALT 31 01/07/2021   AST 21 01/07/2021   ALKPHOS 99 01/07/2021   BILITOT 0.3 01/07/2021   Lab Results  Component Value Date   HGBA1C 5.8 (H) 02/11/2021   Lab Results  Component Value Date   INSULIN 20.4 02/11/2021   Lab Results  Component Value Date   TSH 1.420 02/11/2021   Lab Results  Component Value Date   CHOL 149 01/07/2021   HDL 30 (L) 01/07/2021   LDLCALC 92 01/07/2021   TRIG 155 (H) 01/07/2021   CHOLHDL 5.0 01/07/2021   Lab Results  Component Value Date   WBC 8.6 01/07/2021   HGB 15.1 01/07/2021   HCT 45.0 01/07/2021   MCV 85 01/07/2021   PLT 301  01/07/2021   Attestation Statements:   This is the patient's first visit at [facility]. The patient's NEW PATIENT PACKET was reviewed at length. Included in the packet: current and past health history, medications, allergies, ROS, gynecologic history (women only), surgical history, family history, social history, weight history, weight loss surgery history (for those that have had weight loss surgery), nutritional evaluation, mood and food questionnaire, PHQ9, Epworth questionnaire, sleep habits questionnaire, patient life and health improvement goals questionnaire. These will all be scanned into  the patient's chart under media.   During the visit, I independently reviewed the patient's EKG, bioimpedance scale results, and indirect calorimeter results. I used this information to tailor a meal plan for the patient that will help him to lose weight and will improve his obesity-related conditions going forward. I performed a medically necessary appropriate examination and/or evaluation. I discussed the assessment and treatment plan with the patient. The patient was provided an opportunity to ask questions and all were answered. The patient agreed with the plan and demonstrated an understanding of the instructions. Labs were ordered at this visit and will be reviewed at the next visit unless more critical results need to be addressed immediately. Clinical information was updated and documented in the EMR.   I, Insurance claims handler, CMA, am acting as Energy manager for [employer], DO.  I have reviewed the above documentation for accuracy and completeness, and I agree with the above. Carlye Grippe, D.O.  The 21st Century Cures Act was signed into law in 2016 which includes the topic of electronic health records.  This provides immediate access to information in MyChart.  This includes consultation notes, operative notes, office notes, lab results and pathology reports.  If you have any  questions about what you read please let us know at your next visit so we can discuss your concerns and take corrective action if need be.  We are right here with you.

## 2021-02-25 ENCOUNTER — Ambulatory Visit (INDEPENDENT_AMBULATORY_CARE_PROVIDER_SITE_OTHER): Payer: 59 | Admitting: Family Medicine

## 2021-02-25 NOTE — Telephone Encounter (Signed)
Per Epic, pt read my chart message 

## 2021-03-08 ENCOUNTER — Other Ambulatory Visit (HOSPITAL_COMMUNITY): Payer: Self-pay

## 2021-03-08 MED FILL — Albuterol Sulfate Inhal Aero 108 MCG/ACT (90MCG Base Equiv): RESPIRATORY_TRACT | 25 days supply | Qty: 18 | Fill #0 | Status: AC

## 2021-03-11 ENCOUNTER — Ambulatory Visit: Payer: 59 | Admitting: Family Medicine

## 2021-03-11 DIAGNOSIS — M5416 Radiculopathy, lumbar region: Secondary | ICD-10-CM | POA: Diagnosis not present

## 2021-03-17 ENCOUNTER — Ambulatory Visit (INDEPENDENT_AMBULATORY_CARE_PROVIDER_SITE_OTHER): Payer: 59 | Admitting: Family Medicine

## 2021-03-18 ENCOUNTER — Ambulatory Visit (INDEPENDENT_AMBULATORY_CARE_PROVIDER_SITE_OTHER): Payer: 59 | Admitting: Family Medicine

## 2021-03-18 ENCOUNTER — Other Ambulatory Visit: Payer: Self-pay

## 2021-03-18 ENCOUNTER — Other Ambulatory Visit (HOSPITAL_COMMUNITY): Payer: Self-pay

## 2021-03-18 ENCOUNTER — Encounter (INDEPENDENT_AMBULATORY_CARE_PROVIDER_SITE_OTHER): Payer: Self-pay | Admitting: Family Medicine

## 2021-03-18 VITALS — BP 120/76 | HR 84 | Temp 98.0°F | Ht 75.0 in | Wt 373.0 lb

## 2021-03-18 DIAGNOSIS — E538 Deficiency of other specified B group vitamins: Secondary | ICD-10-CM | POA: Diagnosis not present

## 2021-03-18 DIAGNOSIS — E786 Lipoprotein deficiency: Secondary | ICD-10-CM | POA: Diagnosis not present

## 2021-03-18 DIAGNOSIS — I10 Essential (primary) hypertension: Secondary | ICD-10-CM | POA: Diagnosis not present

## 2021-03-18 DIAGNOSIS — E66813 Obesity, class 3: Secondary | ICD-10-CM

## 2021-03-18 DIAGNOSIS — Z6841 Body Mass Index (BMI) 40.0 and over, adult: Secondary | ICD-10-CM

## 2021-03-18 DIAGNOSIS — E559 Vitamin D deficiency, unspecified: Secondary | ICD-10-CM

## 2021-03-18 DIAGNOSIS — E785 Hyperlipidemia, unspecified: Secondary | ICD-10-CM | POA: Diagnosis not present

## 2021-03-18 DIAGNOSIS — R7303 Prediabetes: Secondary | ICD-10-CM

## 2021-03-18 MED ORDER — VITAMIN D (ERGOCALCIFEROL) 1.25 MG (50000 UNIT) PO CAPS
50000.0000 [IU] | ORAL_CAPSULE | ORAL | 0 refills | Status: DC
  Filled 2021-03-18 – 2021-03-26 (×2): qty 4, 28d supply, fill #0

## 2021-03-18 MED ORDER — VITAMIN B-12 100 MCG PO TABS: ORAL_TABLET | ORAL | Status: DC

## 2021-03-19 DIAGNOSIS — R7303 Prediabetes: Secondary | ICD-10-CM | POA: Insufficient documentation

## 2021-03-19 DIAGNOSIS — E786 Lipoprotein deficiency: Secondary | ICD-10-CM | POA: Insufficient documentation

## 2021-03-19 DIAGNOSIS — E538 Deficiency of other specified B group vitamins: Secondary | ICD-10-CM | POA: Insufficient documentation

## 2021-03-26 ENCOUNTER — Other Ambulatory Visit (HOSPITAL_COMMUNITY): Payer: Self-pay

## 2021-03-26 NOTE — Progress Notes (Addendum)
Chief Complaint:   OBESITY Randy Navarro is here to discuss his progress with his obesity treatment plan along with follow-up of his obesity related diagnoses.   Today's visit was #: 2 Starting weight: 376 lbs Starting date: 02/11/2021 Today's weight: 373 lbs Today's date: 03/18/2021 Weight change since last visit: 3 lbs Total lbs lost to date: 3 lbs Body mass index is 46.62 kg/m.  Total weight loss percentage to date: -0.80%  Interim History: Randy Navarro is here today for his first follow-up office visit since starting the program with [place].  All blood work/ lab tests that were recently ordered by myself or an outside provider were reviewed with patient today per their request.   Extended time was spent counseling him on all new disease processes that were discovered or preexisting ones that are worsening.  he understands that many of these abnormalities will need to monitored regularly along with the current treatment plan of prudent dietary changes, in which we are making each and every office visit, to improve these health parameters.  We reviewed his new meal plan in detail and questions were answered.  Patient's food recall appears to be accurate and consistent with what is on plan when he is following it.   When eating on plan, his hunger and cravings are well controlled.    Randy Navarro has been skipping meals and foods at time, and at other times, he has been making poor choices.  He is here with his wife, who is also in the program and sees Dr. Manson Passey.  Snacks on [place] oatmeal bars, crackers, etc.  Current Meal Plan: the Category 4 Plan for 50% of the time.  Current Exercise Plan: None.  Assessment/Plan:   Meds ordered this encounter  Medications   vitamin B-12 (CYANOCOBALAMIN) 100 MCG tablet    Sig: 100- 400 mcg qd   Vitamin D, Ergocalciferol, (DRISDOL) 1.25 MG (50000 UNIT) CAPS capsule    Sig: Take 1 capsule by mouth every 7 days.    Dispense:  4 capsule    Refill:  0        1. Essential hypertension At goal. Medications: lisinopril 20 mg daily.   Plan:  Bp At goal today.  Discussed labs with patient today.  Serum crt a little high.  Avoid NSAIDS and nephrotoxic substances.   Avoid buying foods that are: processed, frozen, or prepackaged to avoid excess salt.  We will watch for signs of hypotension as he continues lifestyle modifications.  BP Readings from Last 3 Encounters:  03/18/21 120/76  02/11/21 115/75  01/07/21 (!) 140/92   Lab Results  Component Value Date   CREATININE 1.27 01/07/2021       2. Hyperlipidemia ( TG's)  with low HDL Course: Not at goal. Lipid-lowering medications: None.  Elevated TG and low HDL in the past.   Plan:  Discussed labs with patient today.  Dietary changes: Increase soluble fiber, decrease simple carbohydrates, decrease saturated fat. Exercise changes: Moderate to vigorous-intensity aerobic activity 150 minutes per week or as tolerated. We will continue to monitor along with PCP/specialists as it pertains to his weight loss journey.  Eventually increase exercise as tolerated.  Lab Results  Component Value Date   CHOL 149 01/07/2021   HDL 30 (L) 01/07/2021   LDLCALC 92 01/07/2021   TRIG 155 (H) 01/07/2021   CHOLHDL 5.0 01/07/2021   Lab Results  Component Value Date   ALT 31 01/07/2021   AST 21 01/07/2021   ALKPHOS 99  01/07/2021   BILITOT 0.3 01/07/2021       3. Prediabetes Not at goal. Goal is HgbA1c < 5.7.  Medication: None.    Plan:  NEW ONSET.   Discussed labs with patient today.  Handouts given.  Consider meds in future prn.  Follow prudent nutritional plan and weight loss.  Decrease simple carbohydrates and follow meal plan.  He will continue to focus on protein-rich, low simple carbohydrate foods. We reviewed the importance of hydration, regular exercise for stress reduction, and restorative sleep.  Recheck labs in 3 months or so.  Lab Results  Component Value Date   HGBA1C 5.8 (H) 02/11/2021    Lab Results  Component Value Date   INSULIN 20.4 02/11/2021      4. B12 deficiency Lab Results  Component Value Date   VITAMINB12 251 02/11/2021   Supplementation: None. Endorses fatigue. CBC stable  Plan:  New.  Discussed labs with patient today.  Counseling done on importance of B12.   -  Start vitamin B12 400 mcg daily.  He will be increasing his b12 through diet as well on our meal plan.  Reck 3-4 mo  - OTC vitamin B-12 (CYANOCOBALAMIN) 100 MCG tablet; 400 mcg qd      5. Vitamin D deficiency Not at goal. Current vitamin D is 11.4, tested on 02/11/2021. Optimal goal > 50 ng/dL.    Plan:  - Discussed importance of vitamin D to their health and well-being.  - possible symptoms of low Vitamin D can be low energy, depressed mood, muscle aches, joint aches, osteoporosis etc. - low Vitamin D levels may be linked to an increased risk of cardiovascular events and even increased risk of cancers- such as colon and breast.  - I recommend pt take a weekly prescription vit D - see script below   - Informed patient this may be a lifelong thing, and he was encouraged to continue to take the medicine until told otherwise.   - we will need to monitor levels regularly (every 3-4 mo on average) to keep levels within normal limits.  - weight loss will likely improve availability of vitamin D, thus encouraged Randy Navarro to continue with meal plan and their weight loss efforts to further improve this condition - pt's questions and concerns regarding this condition addressed.   - Start Vitamin D, Ergocalciferol, (DRISDOL) 1.25 MG (50000 UNIT) CAPS capsule; Take 1 capsule by mouth every 7 days.  Dispense: 4 capsule; Refill: 0    6. Obesity, current BMI 46.7  Course: Randy Navarro is currently in the action stage of change. As such, his goal is to continue with weight loss efforts.   Nutrition goals: He has agreed to the Category 4 Plan.   Exercise goals:  As is.  Behavioral modification strategies:  no skipping meals, meal planning and cooking strategies, keeping healthy foods in the home, and planning for success.  Randy Navarro has agreed to follow-up with our clinic in 2-3 weeks. He was informed of the importance of frequent follow-up visits to maximize his success with intensive lifestyle modifications for his multiple health conditions.    Objective:   Blood pressure 120/76, pulse 84, temperature 98 F (36.7 C), height 6\' 3"  (1.905 m), weight (!) 373 lb (169.2 kg), SpO2 97 %. Body mass index is 46.62 kg/m.  General: Cooperative, alert, well developed, in no acute distress. HEENT: Conjunctivae and lids unremarkable. Cardiovascular: Regular rhythm.  Lungs: Normal work of breathing. Neurologic: No focal deficits.   Lab Results  Component  Value Date   CREATININE 1.27 01/07/2021   BUN 11 01/07/2021   NA 140 01/07/2021   K 4.6 01/07/2021   CL 100 01/07/2021   CO2 25 01/07/2021   Lab Results  Component Value Date   ALT 31 01/07/2021   AST 21 01/07/2021   ALKPHOS 99 01/07/2021   BILITOT 0.3 01/07/2021   Lab Results  Component Value Date   HGBA1C 5.8 (H) 02/11/2021   Lab Results  Component Value Date   INSULIN 20.4 02/11/2021   Lab Results  Component Value Date   TSH 1.420 02/11/2021   Lab Results  Component Value Date   CHOL 149 01/07/2021   HDL 30 (L) 01/07/2021   LDLCALC 92 01/07/2021   TRIG 155 (H) 01/07/2021   CHOLHDL 5.0 01/07/2021   Lab Results  Component Value Date   WBC 8.6 01/07/2021   HGB 15.1 01/07/2021   HCT 45.0 01/07/2021   MCV 85 01/07/2021   PLT 301 01/07/2021   Attestation Statements:   Reviewed by clinician on day of visit: allergies, medications, problem list, medical history, surgical history, family history, social history, and previous encounter notes.  Time spent on visit including pre-visit chart review and post-visit care and charting was >50 minutes.   I, Insurance claims handler, CMA, am acting as Energy manager for [employer],  DO.  I have reviewed the above documentation for accuracy and completeness, and I agree with the above. Carlye Grippe, D.O.  The 21st Century Cures Act was signed into law in 2016 which includes the topic of electronic health records.  This provides immediate access to information in MyChart.  This includes consultation notes, operative notes, office notes, lab results and pathology reports.  If you have any questions about what you read please let us know at your next visit so we can discuss your concerns and take corrective action if need be.  We are right here with you.

## 2021-03-31 DIAGNOSIS — M47816 Spondylosis without myelopathy or radiculopathy, lumbar region: Secondary | ICD-10-CM | POA: Diagnosis not present

## 2021-04-01 ENCOUNTER — Encounter: Payer: Self-pay | Admitting: Family Medicine

## 2021-04-01 ENCOUNTER — Other Ambulatory Visit (HOSPITAL_COMMUNITY): Payer: Self-pay

## 2021-04-01 ENCOUNTER — Other Ambulatory Visit: Payer: Self-pay

## 2021-04-01 ENCOUNTER — Ambulatory Visit: Payer: 59 | Admitting: Family Medicine

## 2021-04-01 VITALS — BP 138/78 | Temp 97.3°F | Wt 375.0 lb

## 2021-04-01 DIAGNOSIS — Z8616 Personal history of COVID-19: Secondary | ICD-10-CM

## 2021-04-01 DIAGNOSIS — J4530 Mild persistent asthma, uncomplicated: Secondary | ICD-10-CM | POA: Diagnosis not present

## 2021-04-01 MED ORDER — ALBUTEROL SULFATE HFA 108 (90 BASE) MCG/ACT IN AERS
2.0000 | INHALATION_SPRAY | Freq: Four times a day (QID) | RESPIRATORY_TRACT | 2 refills | Status: DC | PRN
  Filled 2021-04-01: qty 18, 25d supply, fill #0
  Filled 2021-07-26: qty 18, 25d supply, fill #1
  Filled 2021-11-09: qty 18, 25d supply, fill #2

## 2021-04-01 MED ORDER — LISINOPRIL 20 MG PO TABS: 20.0000 mg | ORAL_TABLET | Freq: Every day | ORAL | 1 refills | Status: DC

## 2021-04-01 MED ORDER — ADVAIR HFA 115-21 MCG/ACT IN AERO
2.0000 | INHALATION_SPRAY | Freq: Two times a day (BID) | RESPIRATORY_TRACT | 2 refills | Status: DC
  Filled 2021-04-01: qty 12, 30d supply, fill #0

## 2021-04-01 NOTE — Progress Notes (Signed)
Patient ID: Randy Navarro, male    DOB: 1981/08/16, 40 y.o.   MRN: [id number]   Chief Complaint  Patient presents with   Asthma   Subjective:    HPI Pt here to discuss asthma after having COVID back in May. Pt had persistent cough up until last week. Has improved but still having issues.    Feeling breathing improving in the past week. Had covid in may. Coughing started after covid resolved.   Using cough drops and inhaler.  Rarely coughing up mucous.  No fever.   Using less of albuterol per day in the past week.  Starting about 1-2 month ago.  And working on weight loss.   Medical History Randy Navarro has a past medical history of ADD (attention deficit disorder), ADHD (attention deficit hyperactivity disorder), Asthma, Back pain, Blood pressure elevated without history of HTN, Heartburn, Hypertension, IBS (irritable bowel syndrome), Joint pain, Pneumonia (age 12 year), SOBOE (shortness of breath on exertion), and Swelling of both lower extremities.   Outpatient Encounter Medications as of 04/01/2021  Medication Sig   fluticasone-salmeterol (ADVAIR HFA) 115-21 MCG/ACT inhaler Inhale 2 puffs into the lungs 2 (two) times daily.   methocarbamol (ROBAXIN) 500 MG tablet TAKE 1 TABLET BY MOUTH TWICE A DAY   naproxen (NAPROSYN) 500 MG tablet naproxen 500 mg tablet  TAKE 1 TABLET BY MOUTH TWICE A DAY WITH FOOD   oxyCODONE-acetaminophen (PERCOCET) 5-325 MG tablet Take 1 tablet by mouth every 6 (six) hours as needed.   UNABLE TO FIND Vitamin A 2400 1 tab daily Vitamin E 450 mg 1 tab daily Glucosamine Chondroitin 1000 mcg 1 daily Vitamin D3 + K2:  D3-125 mcg, K2-126mcg 1 daily Vitamin C gummy  282 mg 1 daily   vitamin B-12 (CYANOCOBALAMIN) 100 MCG tablet 100- 400 mcg qd   [DISCONTINUED] albuterol (VENTOLIN HFA) 108 (90 Base) MCG/ACT inhaler INHALE 2 PUFFS INTO THE LUNGS EVERY 6 HOURS AS NEEDED FOR WHEEZING OR SHORTNESS OF BREATH.   [DISCONTINUED] lisinopril (ZESTRIL) 20 MG tablet Take 1  tablet (20 mg total) by mouth daily.   [DISCONTINUED] Vitamin D, Ergocalciferol, (DRISDOL) 1.25 MG (50000 UNIT) CAPS capsule Take 1 capsule by mouth every 7 days.   albuterol (VENTOLIN HFA) 108 (90 Base) MCG/ACT inhaler INHALE 2 PUFFS INTO THE LUNGS EVERY 6 HOURS AS NEEDED FOR WHEEZING OR SHORTNESS OF BREATH.   lisinopril (ZESTRIL) 20 MG tablet Take 1 tablet (20 mg total) by mouth daily.   [DISCONTINUED] fluticasone (FLOVENT HFA) 44 MCG/ACT inhaler TAKE 2 PUFFS BY MOUTH INTO THE LUNGS 2 TIMES DAILY   No facility-administered encounter medications on file as of 04/01/2021.     Review of Systems  Constitutional:  Negative for chills and fever.  HENT:  Negative for congestion, rhinorrhea and sore throat.   Respiratory:  Positive for cough (improving). Negative for shortness of breath and wheezing.   Cardiovascular:  Negative for chest pain and leg swelling.  Gastrointestinal:  Negative for abdominal pain, diarrhea, nausea and vomiting.  Genitourinary:  Negative for dysuria and frequency.  Skin:  Negative for rash.  Neurological:  Negative for dizziness, weakness and headaches.    Vitals BP 138/78   Temp (!) 97.3 F (36.3 C)   Wt (!) 375 lb (170.1 kg)   BMI 46.87 kg/m   Objective:   Physical Exam Vitals and nursing note reviewed.  Constitutional:      General: He is not in acute distress.    Appearance: Normal appearance. He is obese.  He is not ill-appearing.  HENT:     Head: Normocephalic.     Nose: Nose normal. No congestion.     Mouth/Throat:     Mouth: Mucous membranes are moist.     Pharynx: No oropharyngeal exudate.  Eyes:     Extraocular Movements: Extraocular movements intact.     Conjunctiva/sclera: Conjunctivae normal.     Pupils: Pupils are equal, round, and reactive to light.  Cardiovascular:     Rate and Rhythm: Normal rate and regular rhythm.     Pulses: Normal pulses.     Heart sounds: Normal heart sounds. No murmur heard. Pulmonary:     Effort: Pulmonary  effort is normal.     Breath sounds: Normal breath sounds. No wheezing, rhonchi or rales.  Musculoskeletal:        General: Normal range of motion.     Right lower leg: No edema.     Left lower leg: No edema.  Skin:    General: Skin is warm and dry.     Findings: No rash.  Neurological:     General: No focal deficit present.     Mental Status: He is alert and oriented to person, place, and time.     Cranial Nerves: No cranial nerve deficit.  Psychiatric:        Mood and Affect: Mood normal.        Behavior: Behavior normal.        Thought Content: Thought content normal.        Judgment: Judgment normal.     Assessment and Plan   1. Mild persistent asthma without complication  2. History of COVID-19   Asthma- mild ,persistent- Will give advair and then refill albuterol.  Lisinopril doing well, pt denies this causing the coughing.  Will cont to monitor.  Xray if not improivng in next 2-3 wks.or any changes.  Obesity-  Pt working with weight loss clinic and working toward weight loss.   Return in about 6 months (around 10/01/2021) for f/u 33mo asthma/bp.

## 2021-04-01 NOTE — Patient Instructions (Signed)
Vit B12- cyanocobalamin- .  100-419mcg per day.

## 2021-04-02 ENCOUNTER — Other Ambulatory Visit (HOSPITAL_COMMUNITY): Payer: Self-pay

## 2021-04-07 ENCOUNTER — Ambulatory Visit (INDEPENDENT_AMBULATORY_CARE_PROVIDER_SITE_OTHER): Payer: 59 | Admitting: Family Medicine

## 2021-04-07 ENCOUNTER — Other Ambulatory Visit: Payer: Self-pay

## 2021-04-07 ENCOUNTER — Other Ambulatory Visit (HOSPITAL_COMMUNITY): Payer: Self-pay

## 2021-04-07 ENCOUNTER — Encounter (INDEPENDENT_AMBULATORY_CARE_PROVIDER_SITE_OTHER): Payer: Self-pay | Admitting: Family Medicine

## 2021-04-07 VITALS — BP 132/83 | HR 88 | Temp 98.1°F | Ht 75.0 in | Wt 367.0 lb

## 2021-04-07 DIAGNOSIS — I10 Essential (primary) hypertension: Secondary | ICD-10-CM | POA: Diagnosis not present

## 2021-04-07 DIAGNOSIS — Z9189 Other specified personal risk factors, not elsewhere classified: Secondary | ICD-10-CM | POA: Diagnosis not present

## 2021-04-07 DIAGNOSIS — E538 Deficiency of other specified B group vitamins: Secondary | ICD-10-CM | POA: Diagnosis not present

## 2021-04-07 DIAGNOSIS — Z6841 Body Mass Index (BMI) 40.0 and over, adult: Secondary | ICD-10-CM

## 2021-04-07 DIAGNOSIS — E559 Vitamin D deficiency, unspecified: Secondary | ICD-10-CM | POA: Diagnosis not present

## 2021-04-07 MED ORDER — VITAMIN D (ERGOCALCIFEROL) 1.25 MG (50000 UNIT) PO CAPS
50000.0000 [IU] | ORAL_CAPSULE | ORAL | 0 refills | Status: DC
  Filled 2021-04-07: qty 4, 28d supply, fill #0

## 2021-04-15 ENCOUNTER — Other Ambulatory Visit: Payer: Self-pay | Admitting: Family Medicine

## 2021-04-15 ENCOUNTER — Other Ambulatory Visit (HOSPITAL_COMMUNITY): Payer: Self-pay

## 2021-04-15 MED ORDER — LISINOPRIL 20 MG PO TABS
20.0000 mg | ORAL_TABLET | Freq: Every day | ORAL | 1 refills | Status: DC
  Filled 2021-04-15: qty 90, 90d supply, fill #0
  Filled 2021-07-26: qty 90, 90d supply, fill #1

## 2021-04-16 NOTE — Progress Notes (Signed)
Chief Complaint:   OBESITY Randy Navarro is here to discuss his progress with his obesity treatment plan along with follow-up of his obesity related diagnoses.   Today's visit was #: 3 Starting weight: 376 lbs Starting date: 02/11/2021 Today's weight: 367 lbs Today's date: 04/07/2021 Weight change since last visit: 6 lbs Total lbs lost to date: 9 lbs Body mass index is 45.87 kg/m.  Total weight loss percentage to date: -2.39%  Interim History:  Randy Navarro is here for a follow up office visit.  We reviewed his meal plan and questions were answered.  Patient's food recall appears to be accurate and consistent with what is on plan when he is following it.   When eating on plan, his hunger and cravings are well controlled.    Randy Navarro says he does not calculate her snack calories and likely goes over, per wife, who is with him today.  Difficult to eat all foods on plan as well.  Current Meal Plan: the Category 4 Plan for 80-90% of the time.  Current Exercise Plan: None.  Assessment/Plan:   Medications Discontinued During This Encounter  Medication Reason   Vitamin D, Ergocalciferol, (DRISDOL) 1.25 MG (50000 UNIT) CAPS capsule Reorder   Meds ordered this encounter  Medications   Vitamin D, Ergocalciferol, (DRISDOL) 1.25 MG (50000 UNIT) CAPS capsule    Sig: Take 1 capsule by mouth every 7 days.    Dispense:  4 capsule    Refill:  0   1. Vitamin D deficiency Not at goal.  She is taking vitamin D 50,000 IU weekly.  Started at last visit.  Tolerating well.  No side effects or concerns.  Plan: - Reiterated importance of vitamin D (as well as calcium) to their health and wellbeing.  - Reminded Randy Navarro that weight loss will likely improve availability of vitamin D, thus encouraged him to continue with meal plan and their weight loss efforts to further improve this condition. - I recommend patient continue to take weekly prescription vit D 50,000 IU - Informed patient this may be a  lifelong thing, and he was encouraged to continue to take the medicine until told otherwise.   - we will need to monitor levels regularly (every 3-4 mo on average) to keep levels within normal limits.  - weight loss will likely improve availability of vitamin D, thus encouraged Randy Navarro to continue with meal plan and their weight loss efforts to further improve this condition - pt's questions and concerns regarding this condition addressed.  Lab Results  Component Value Date   VD25OH 11.4 (L) 02/11/2021   - Refill Vitamin D, Ergocalciferol, (DRISDOL) 1.25 MG (50000 UNIT) CAPS capsule; Take 1 capsule by mouth every 7 days.  Dispense: 4 capsule; Refill: 0  2. Essential hypertension At goal. Medications: lisinopril 20 mg daily.   Plan:  Continue lisinopril.  Avoid buying foods that are: processed, frozen, or prepackaged to avoid excess salt. We will watch for signs of hypotension as he continues lifestyle modifications. We will continue to monitor closely alongside his PCP and/or Specialist.  Regular follow up with PCP and specialists was also encouraged.   BP Readings from Last 3 Encounters:  04/07/21 132/83  04/01/21 138/78  03/18/21 120/76   Lab Results  Component Value Date   CREATININE 1.27 01/07/2021   3. B12 nutritional deficiency Lab Results  Component Value Date   VITAMINB12 251 02/11/2021   He is taking vitamin B12 OTC that we recommended he start at last  office visit with no issues.  Slightly improved fatigue.  Counseling done.  Plan:  Continue OTC vitamin B12 400 mcg daily.  Continue prudent nutritional plan.  Recheck level in 3-4 months.    4. At risk for malnutrition Randy Navarro was given extensive malnutrition prevention education and counseling today of more than 9 minutes.  Counseled him that malnutrition refers to inappropriate nutrients or not the right balance of nutrients for optimal health.  Discussed with Randy Navarro that it is absolutely possible to be malnourished but  yet obese.  Risk factors, including but not limited to, inappropriate dietary choices, difficulty with obtaining food due to physical or financial limitations, and various physical and mental health conditions were reviewed with Randy Navarro.   5. Obesity BMI today 45  Course: Randy Navarro is currently in the action stage of change. As such, his goal is to continue with weight loss efforts.   Nutrition goals: He has agreed to the Category 4 Plan.   Exercise goals:  As is, but he may start walking with his wife.  Behavioral modification strategies: increasing water intake, better snacking choices, and avoiding temptations.  Randy Navarro has agreed to follow-up with our clinic in 2-3 weeks. He was informed of the importance of frequent follow-up visits to maximize his success with intensive lifestyle modifications for his multiple health conditions.   Objective:   Blood pressure 132/83, pulse 88, temperature 98.1 F (36.7 C), height 6\' 3"  (1.905 m), weight (!) 367 lb (166.5 kg), SpO2 97 %. Body mass index is 45.87 kg/m.  General: Cooperative, alert, well developed, in no acute distress. HEENT: Conjunctivae and lids unremarkable. Cardiovascular: Regular rhythm.  Lungs: Normal work of breathing. Neurologic: No focal deficits.   Lab Results  Component Value Date   CREATININE 1.27 01/07/2021   BUN 11 01/07/2021   NA 140 01/07/2021   K 4.6 01/07/2021   CL 100 01/07/2021   CO2 25 01/07/2021   Lab Results  Component Value Date   ALT 31 01/07/2021   AST 21 01/07/2021   ALKPHOS 99 01/07/2021   BILITOT 0.3 01/07/2021   Lab Results  Component Value Date   HGBA1C 5.8 (H) 02/11/2021   Lab Results  Component Value Date   INSULIN 20.4 02/11/2021   Lab Results  Component Value Date   TSH 1.420 02/11/2021   Lab Results  Component Value Date   CHOL 149 01/07/2021   HDL 30 (L) 01/07/2021   LDLCALC 92 01/07/2021   TRIG 155 (H) 01/07/2021   CHOLHDL 5.0 01/07/2021   Lab Results  Component  Value Date   VD25OH 11.4 (L) 02/11/2021   Lab Results  Component Value Date   WBC 8.6 01/07/2021   HGB 15.1 01/07/2021   HCT 45.0 01/07/2021   MCV 85 01/07/2021   PLT 301 01/07/2021   Attestation Statements:   Reviewed by clinician on day of visit: allergies, medications, problem list, medical history, surgical history, family history, social history, and previous encounter notes.  I, 01/09/2021, CMA, am acting as Insurance claims handler for Energy manager, DO.  I have reviewed the above documentation for accuracy and completeness, and I agree with the above. [employer], D.O.  The 21st Century Cures Act was signed into law in 2016 which includes the topic of electronic health records.  This provides immediate access to information in MyChart.  This includes consultation notes, operative notes, office notes, lab results and pathology reports.  If you have any questions about what you read please  let us know at your next visit so we can discuss your concerns and take corrective action if need be.  We are right here with you.

## 2021-04-17 ENCOUNTER — Other Ambulatory Visit (HOSPITAL_COMMUNITY): Payer: Self-pay

## 2021-04-21 ENCOUNTER — Ambulatory Visit: Payer: 59 | Admitting: Family Medicine

## 2021-04-25 ENCOUNTER — Other Ambulatory Visit (HOSPITAL_COMMUNITY): Payer: Self-pay

## 2021-04-25 DIAGNOSIS — R262 Difficulty in walking, not elsewhere classified: Secondary | ICD-10-CM | POA: Diagnosis not present

## 2021-04-25 DIAGNOSIS — M6281 Muscle weakness (generalized): Secondary | ICD-10-CM | POA: Diagnosis not present

## 2021-04-25 DIAGNOSIS — M5459 Other low back pain: Secondary | ICD-10-CM | POA: Diagnosis not present

## 2021-04-28 ENCOUNTER — Ambulatory Visit (INDEPENDENT_AMBULATORY_CARE_PROVIDER_SITE_OTHER): Payer: 59 | Admitting: Family Medicine

## 2021-04-28 DIAGNOSIS — R262 Difficulty in walking, not elsewhere classified: Secondary | ICD-10-CM | POA: Diagnosis not present

## 2021-04-28 DIAGNOSIS — M5459 Other low back pain: Secondary | ICD-10-CM | POA: Diagnosis not present

## 2021-04-28 DIAGNOSIS — M6281 Muscle weakness (generalized): Secondary | ICD-10-CM | POA: Diagnosis not present

## 2021-04-30 DIAGNOSIS — M5459 Other low back pain: Secondary | ICD-10-CM | POA: Diagnosis not present

## 2021-04-30 DIAGNOSIS — R262 Difficulty in walking, not elsewhere classified: Secondary | ICD-10-CM | POA: Diagnosis not present

## 2021-04-30 DIAGNOSIS — M6281 Muscle weakness (generalized): Secondary | ICD-10-CM | POA: Diagnosis not present

## 2021-05-05 DIAGNOSIS — R262 Difficulty in walking, not elsewhere classified: Secondary | ICD-10-CM | POA: Diagnosis not present

## 2021-05-05 DIAGNOSIS — M5459 Other low back pain: Secondary | ICD-10-CM | POA: Diagnosis not present

## 2021-05-05 DIAGNOSIS — M6281 Muscle weakness (generalized): Secondary | ICD-10-CM | POA: Diagnosis not present

## 2021-05-06 DIAGNOSIS — M47816 Spondylosis without myelopathy or radiculopathy, lumbar region: Secondary | ICD-10-CM | POA: Diagnosis not present

## 2021-05-07 ENCOUNTER — Encounter (INDEPENDENT_AMBULATORY_CARE_PROVIDER_SITE_OTHER): Payer: Self-pay | Admitting: Family Medicine

## 2021-05-07 ENCOUNTER — Ambulatory Visit (INDEPENDENT_AMBULATORY_CARE_PROVIDER_SITE_OTHER): Payer: 59 | Admitting: Family Medicine

## 2021-05-07 ENCOUNTER — Other Ambulatory Visit: Payer: Self-pay

## 2021-05-07 ENCOUNTER — Other Ambulatory Visit (HOSPITAL_COMMUNITY): Payer: Self-pay

## 2021-05-07 VITALS — BP 115/70 | HR 71 | Temp 97.7°F | Ht 75.0 in | Wt 364.0 lb

## 2021-05-07 DIAGNOSIS — I10 Essential (primary) hypertension: Secondary | ICD-10-CM

## 2021-05-07 DIAGNOSIS — E559 Vitamin D deficiency, unspecified: Secondary | ICD-10-CM

## 2021-05-07 DIAGNOSIS — E538 Deficiency of other specified B group vitamins: Secondary | ICD-10-CM | POA: Diagnosis not present

## 2021-05-07 DIAGNOSIS — Z9189 Other specified personal risk factors, not elsewhere classified: Secondary | ICD-10-CM

## 2021-05-07 DIAGNOSIS — Z6841 Body Mass Index (BMI) 40.0 and over, adult: Secondary | ICD-10-CM

## 2021-05-07 MED ORDER — VITAMIN D (ERGOCALCIFEROL) 1.25 MG (50000 UNIT) PO CAPS
50000.0000 [IU] | ORAL_CAPSULE | ORAL | 0 refills | Status: DC
  Filled 2021-05-07: qty 4, 28d supply, fill #0

## 2021-05-09 DIAGNOSIS — M6281 Muscle weakness (generalized): Secondary | ICD-10-CM | POA: Diagnosis not present

## 2021-05-09 DIAGNOSIS — M5459 Other low back pain: Secondary | ICD-10-CM | POA: Diagnosis not present

## 2021-05-09 DIAGNOSIS — R262 Difficulty in walking, not elsewhere classified: Secondary | ICD-10-CM | POA: Diagnosis not present

## 2021-05-12 DIAGNOSIS — R262 Difficulty in walking, not elsewhere classified: Secondary | ICD-10-CM | POA: Diagnosis not present

## 2021-05-12 DIAGNOSIS — M5459 Other low back pain: Secondary | ICD-10-CM | POA: Diagnosis not present

## 2021-05-12 DIAGNOSIS — M6281 Muscle weakness (generalized): Secondary | ICD-10-CM | POA: Diagnosis not present

## 2021-05-12 NOTE — Progress Notes (Signed)
Chief Complaint:   OBESITY Sylvio is here to discuss his progress with his obesity treatment plan along with follow-up of his obesity related diagnoses. Jhovany is on the Category 4 Plan and states he is following his eating plan approximately 85-90% of the time. Ladarius states he is not currently exercising.  Today's visit was #: 4 Starting weight: 376 lbs Starting date: 02/11/2021 Today's weight: 364 lbs Today's date: 05/07/2021 Total lbs lost to date: 12 Total lbs lost since last in-office visit: 3  Interim History: Kaydan had a family function and ate off plan a lot more than usual. He has no issues with the plan. He is getting 6-7 bottles of water per day.  Subjective:   1. Vitamin D deficiency He is currently taking prescription vitamin D 50,000 IU each week. He denies nausea, vomiting or muscle weakness.  Lab Results  Component Value Date   VD25OH 11.4 (L) 02/11/2021   2. Vitamin B12 deficiency Lionel takes OTC B12 but dose is unknown.  Lab Results  Component Value Date   VITAMINB12 251 02/11/2021   3. Essential hypertension Marius denies symptoms or concerns.  BP Readings from Last 3 Encounters:  05/07/21 115/70  04/07/21 132/83  04/01/21 138/78   Lab Results  Component Value Date   CREATININE 1.27 01/07/2021   4. At risk for impaired metabolic function Brylee is at increased risk for impaired metabolic function due to current nutrition and muscle mass.   Assessment/Plan:  No orders of the defined types were placed in this encounter.   Medications Discontinued During This Encounter  Medication Reason   Vitamin D, Ergocalciferol, (DRISDOL) 1.25 MG (50000 UNIT) CAPS capsule Reorder     Meds ordered this encounter  Medications   Vitamin D, Ergocalciferol, (DRISDOL) 1.25 MG (50000 UNIT) CAPS capsule    Sig: Take 1 capsule by mouth every 7 days.    Dispense:  4 capsule    Refill:  0    Ov for RF, 30 d     1. Vitamin D deficiency Low Vitamin D level  contributes to fatigue and are associated with obesity, breast, and colon cancer. He agrees to continue to take prescription Vitamin D @50 ,000 IU every week and will follow-up for routine testing of Vitamin D, at least 2-3 times per year to avoid over-replacement.  Refill- Vitamin D, Ergocalciferol, (DRISDOL) 1.25 MG (50000 UNIT) CAPS capsule; Take 1 capsule by mouth every 7 days.  Dispense: 4 capsule; Refill: 0  2. Vitamin B12 deficiency Continue daily supplement and let know dose at next OV. The diagnosis was reviewed with the patient. Counseling provided today, see below. We will continue to monitor. Orders and follow up as documented in patient record.  Counseling The body needs vitamin B12: to make red blood cells; to make DNA; and to help the nerves work properly so they can carry messages from the brain to the body.  The main causes of vitamin B12 deficiency include dietary deficiency, digestive diseases, pernicious anemia, and having a surgery in which part of the stomach or small intestine is removed.  Certain medicines can make it harder for the body to absorb vitamin B12. These medicines include: heartburn medications; some antibiotics; some medications used to treat diabetes, gout, and high cholesterol.  In some cases, there are no symptoms of this condition. If the condition leads to anemia or nerve damage, various symptoms can occur, such as weakness or fatigue, shortness of breath, and numbness or tingling in your  hands and feet.   Treatment:  May include taking vitamin B12 supplements.  Avoid alcohol.  Eat lots of healthy foods that contain vitamin B12: Beef, pork, chicken, [place], and organ meats, such as liver.  Seafood: This includes clams, rainbow trout, salmon, tuna, and haddock. Eggs.  Cereal and dairy products that are fortified: This means that vitamin B12 has been added to the food.   3. Essential hypertension Wilford's BP is at goal. He is working on healthy weight  loss and exercise to improve blood pressure control. We will watch for signs of hypotension as he continues his lifestyle modifications. Continue current treatment plan.  4. At risk for impaired metabolic function Benjermin was given approximately 9 minutes of impaired  metabolic function prevention counseling today. We discussed intensive lifestyle modifications today with an emphasis on specific nutrition and exercise instructions and strategies.   Repetitive spaced learning was employed today to elicit superior memory formation and behavioral change.   5. Obesity with current BMI of 45.6  Azion is currently in the action stage of change. As such, his goal is to continue with weight loss efforts. He has agreed to the Category 4 Plan.   Exercise goals:  Start biking 5-10 minutes 3 days a week.  Behavioral modification strategies: increasing lean protein intake, decreasing simple carbohydrates, meal planning and cooking strategies, keeping healthy foods in the home, and celebration eating strategies.  Darrow has agreed to follow-up with our clinic in 2 weeks. He was informed of the importance of frequent follow-up visits to maximize his success with intensive lifestyle modifications for his multiple health conditions.   Objective:   Blood pressure 115/70, pulse 71, temperature 97.7 F (36.5 C), height 6\' 3"  (1.905 m), weight (!) 364 lb (165.1 kg), SpO2 98 %. Body mass index is 45.5 kg/m.  General: Cooperative, alert, well developed, in no acute distress. HEENT: Conjunctivae and lids unremarkable. Cardiovascular: Regular rhythm.  Lungs: Normal work of breathing. Neurologic: No focal deficits.   Lab Results  Component Value Date   CREATININE 1.27 01/07/2021   BUN 11 01/07/2021   NA 140 01/07/2021   K 4.6 01/07/2021   CL 100 01/07/2021   CO2 25 01/07/2021   Lab Results  Component Value Date   ALT 31 01/07/2021   AST 21 01/07/2021   ALKPHOS 99 01/07/2021   BILITOT 0.3 01/07/2021    Lab Results  Component Value Date   HGBA1C 5.8 (H) 02/11/2021   Lab Results  Component Value Date   INSULIN 20.4 02/11/2021   Lab Results  Component Value Date   TSH 1.420 02/11/2021   Lab Results  Component Value Date   CHOL 149 01/07/2021   HDL 30 (L) 01/07/2021   LDLCALC 92 01/07/2021   TRIG 155 (H) 01/07/2021   CHOLHDL 5.0 01/07/2021   Lab Results  Component Value Date   VD25OH 11.4 (L) 02/11/2021   Lab Results  Component Value Date   WBC 8.6 01/07/2021   HGB 15.1 01/07/2021   HCT 45.0 01/07/2021   MCV 85 01/07/2021   PLT 301 01/07/2021    Attestation Statements:   Reviewed by clinician on day of visit: allergies, medications, problem list, medical history, surgical history, family history, social history, and previous encounter notes.  01/09/2021, CMA, am acting as transcriptionist for Edmund Hilda, DO.  I have reviewed the above documentation for accuracy and completeness, and I agree with the above. [employer], D.O.  The 21st Century Cures Act  was signed into law in 2016 which includes the topic of electronic health records.  This provides immediate access to information in MyChart.  This includes consultation notes, operative notes, office notes, lab results and pathology reports.  If you have any questions about what you read please let us know at your next visit so we can discuss your concerns and take corrective action if need be.  We are right here with you.

## 2021-05-19 ENCOUNTER — Other Ambulatory Visit (HOSPITAL_COMMUNITY): Payer: Self-pay

## 2021-05-19 ENCOUNTER — Other Ambulatory Visit: Payer: Self-pay

## 2021-05-19 ENCOUNTER — Encounter (INDEPENDENT_AMBULATORY_CARE_PROVIDER_SITE_OTHER): Payer: Self-pay | Admitting: Family Medicine

## 2021-05-19 ENCOUNTER — Ambulatory Visit (INDEPENDENT_AMBULATORY_CARE_PROVIDER_SITE_OTHER): Payer: 59 | Admitting: Family Medicine

## 2021-05-19 VITALS — BP 138/86 | HR 79 | Temp 98.0°F | Ht 75.0 in | Wt 373.0 lb

## 2021-05-19 DIAGNOSIS — Z9189 Other specified personal risk factors, not elsewhere classified: Secondary | ICD-10-CM | POA: Diagnosis not present

## 2021-05-19 DIAGNOSIS — E559 Vitamin D deficiency, unspecified: Secondary | ICD-10-CM | POA: Diagnosis not present

## 2021-05-19 DIAGNOSIS — Z6841 Body Mass Index (BMI) 40.0 and over, adult: Secondary | ICD-10-CM

## 2021-05-19 DIAGNOSIS — I10 Essential (primary) hypertension: Secondary | ICD-10-CM

## 2021-05-19 MED ORDER — VITAMIN D (ERGOCALCIFEROL) 1.25 MG (50000 UNIT) PO CAPS
50000.0000 [IU] | ORAL_CAPSULE | ORAL | 0 refills | Status: DC
  Filled 2021-05-19: qty 4, 28d supply, fill #0

## 2021-05-20 DIAGNOSIS — R262 Difficulty in walking, not elsewhere classified: Secondary | ICD-10-CM | POA: Diagnosis not present

## 2021-05-20 DIAGNOSIS — M6281 Muscle weakness (generalized): Secondary | ICD-10-CM | POA: Diagnosis not present

## 2021-05-20 DIAGNOSIS — M5459 Other low back pain: Secondary | ICD-10-CM | POA: Diagnosis not present

## 2021-05-20 NOTE — Progress Notes (Signed)
Chief Complaint:   OBESITY Randy Navarro is here to discuss his progress with his obesity treatment plan along with follow-up of his obesity related diagnoses. Randy Navarro is on the Category 4 Plan and states he is following his eating plan approximately 65-70% of the time. Randy Navarro states he is doing physical therapy and walking for 10-25 minutes 4 times per week.  Today's visit was #: 5 Starting weight: 376 lbs Starting date: 02/11/2021 Today's weight: 373 lbs Today's date: 05/19/2021 Total lbs lost to date: 3 Total lbs lost since last in-office visit: +9  Interim History: Randy Navarro had a hectic couple of weeks, skipped meals at times, and dinner meal 2-3 days per week. He also had more celebration eating, picnics, and gatherings as well or ate out more- pizza etc.  Subjective:   1. Essential hypertension Tyrez's blood pressure is on the higher end of normal. Cardiovascular ROS: no chest pain or dyspnea on exertion.  2. Vitamin D deficiency Hendry is currently taking prescription vitamin D 50,000 IU each week. He denies nausea, vomiting or muscle weakness.  3. At risk for fluid volume overload Lorin is at a higher than average risk for fluid retention due to increased salt and eating out.  Assessment/Plan:  No orders of the defined types were placed in this encounter.   Medications Discontinued During This Encounter  Medication Reason   Vitamin D, Ergocalciferol, (DRISDOL) 1.25 MG (50000 UNIT) CAPS capsule Reorder     Meds ordered this encounter  Medications   Vitamin D, Ergocalciferol, (DRISDOL) 1.25 MG (50000 UNIT) CAPS capsule    Sig: Take 1 capsule by mouth every 7 days.    Dispense:  4 capsule    Refill:  0    Ov for RF, 30 d     1. Essential hypertension Randy Navarro will continues his prudent nutritional plan, decrease salt, and weight loss to improve blood pressure control. We will watch for signs of hypotension as he continues his lifestyle modifications.  2. Vitamin D  deficiency Low Vitamin D level contributes to fatigue and are associated with obesity, breast, and colon cancer. We will refill prescription Vitamin D for 1 month. Randy Navarro will follow-up for routine testing of Vitamin D, at least 2-3 times per year to avoid over-replacement.  - Vitamin D, Ergocalciferol, (DRISDOL) 1.25 MG (50000 UNIT) CAPS capsule; Take 1 capsule by mouth every 7 days.  Dispense: 4 capsule; Refill: 0  3. At risk for fluid volume overload Randy Navarro was given approximately 9 minutes of fluid retention prevention counseling today. He is 40 y.o. male and has risk factors for fluid retention including obesity. We discussed intensive lifestyle modifications today with an emphasis on specific weight loss instructions, proper nutrition and exercise strategies.   Repetitive spaced learning was employed today to elicit superior memory formation and behavioral change.  4. Obesity with current BMI of 46.6 Randy Navarro is currently in the action stage of change. As such, his goal is to continue with weight loss efforts. He has agreed to the Category 4 Plan with protein equivalents.   Exercise goals: As is.  Behavioral modification strategies: increasing lean protein intake, decreasing simple carbohydrates, decreasing liquid calories, decreasing eating out, and avoiding temptations.  Randy Navarro has agreed to follow-up with our clinic in 2 to 3 weeks. He was informed of the importance of frequent follow-up visits to maximize his success with intensive lifestyle modifications for his multiple health conditions.   Objective:   Blood pressure 138/86, pulse 79, temperature 98 F (36.7  C), height 6\' 3"  (1.905 m), SpO2 97 %. Body mass index is 45.5 kg/m.  General: Cooperative, alert, well developed, in no acute distress. HEENT: Conjunctivae and lids unremarkable. Cardiovascular: Regular rhythm.  Lungs: Normal work of breathing. Neurologic: No focal deficits.   Lab Results  Component Value Date    CREATININE 1.27 01/07/2021   BUN 11 01/07/2021   NA 140 01/07/2021   K 4.6 01/07/2021   CL 100 01/07/2021   CO2 25 01/07/2021   Lab Results  Component Value Date   ALT 31 01/07/2021   AST 21 01/07/2021   ALKPHOS 99 01/07/2021   BILITOT 0.3 01/07/2021   Lab Results  Component Value Date   HGBA1C 5.8 (H) 02/11/2021   Lab Results  Component Value Date   INSULIN 20.4 02/11/2021   Lab Results  Component Value Date   TSH 1.420 02/11/2021   Lab Results  Component Value Date   CHOL 149 01/07/2021   HDL 30 (L) 01/07/2021   LDLCALC 92 01/07/2021   TRIG 155 (H) 01/07/2021   CHOLHDL 5.0 01/07/2021   Lab Results  Component Value Date   VD25OH 11.4 (L) 02/11/2021   Lab Results  Component Value Date   WBC 8.6 01/07/2021   HGB 15.1 01/07/2021   HCT 45.0 01/07/2021   MCV 85 01/07/2021   PLT 301 01/07/2021   No results found for: IRON, TIBC, FERRITIN  Attestation Statements:   Reviewed by clinician on day of visit: allergies, medications, problem list, medical history, surgical history, family history, social history, and previous encounter notes.   01/09/2021, am acting as transcriptionist for Trude Mcburney, DO.  I have reviewed the above documentation for accuracy and completeness, and I agree with the above. [employer], D.O.  The 21st Century Cures Act was signed into law in 2016 which includes the topic of electronic health records.  This provides immediate access to information in MyChart.  This includes consultation notes, operative notes, office notes, lab results and pathology reports.  If you have any questions about what you read please let 2017 know at your next visit so we can discuss your concerns and take corrective action if need be.  We are right here with you.

## 2021-05-22 DIAGNOSIS — R262 Difficulty in walking, not elsewhere classified: Secondary | ICD-10-CM | POA: Diagnosis not present

## 2021-05-22 DIAGNOSIS — M5459 Other low back pain: Secondary | ICD-10-CM | POA: Diagnosis not present

## 2021-05-22 DIAGNOSIS — M6281 Muscle weakness (generalized): Secondary | ICD-10-CM | POA: Diagnosis not present

## 2021-06-02 DIAGNOSIS — M5136 Other intervertebral disc degeneration, lumbar region: Secondary | ICD-10-CM | POA: Diagnosis not present

## 2021-06-09 ENCOUNTER — Ambulatory Visit (INDEPENDENT_AMBULATORY_CARE_PROVIDER_SITE_OTHER): Payer: 59 | Admitting: Family Medicine

## 2021-06-23 ENCOUNTER — Ambulatory Visit (INDEPENDENT_AMBULATORY_CARE_PROVIDER_SITE_OTHER): Payer: 59 | Admitting: Family Medicine

## 2021-06-30 ENCOUNTER — Other Ambulatory Visit (HOSPITAL_COMMUNITY): Payer: Self-pay

## 2021-06-30 MED ORDER — TIZANIDINE HCL 4 MG PO TABS
ORAL_TABLET | ORAL | 3 refills | Status: DC
  Filled 2021-06-30: qty 30, 10d supply, fill #0
  Filled 2021-11-09: qty 30, 10d supply, fill #1
  Filled 2021-12-04: qty 30, 10d supply, fill #2

## 2021-07-01 DIAGNOSIS — M5459 Other low back pain: Secondary | ICD-10-CM | POA: Diagnosis not present

## 2021-07-01 DIAGNOSIS — R262 Difficulty in walking, not elsewhere classified: Secondary | ICD-10-CM | POA: Diagnosis not present

## 2021-07-01 DIAGNOSIS — M6281 Muscle weakness (generalized): Secondary | ICD-10-CM | POA: Diagnosis not present

## 2021-07-05 ENCOUNTER — Other Ambulatory Visit (HOSPITAL_COMMUNITY): Payer: Self-pay

## 2021-07-07 DIAGNOSIS — M5459 Other low back pain: Secondary | ICD-10-CM | POA: Diagnosis not present

## 2021-07-07 DIAGNOSIS — R262 Difficulty in walking, not elsewhere classified: Secondary | ICD-10-CM | POA: Diagnosis not present

## 2021-07-07 DIAGNOSIS — M6281 Muscle weakness (generalized): Secondary | ICD-10-CM | POA: Diagnosis not present

## 2021-07-09 DIAGNOSIS — M6281 Muscle weakness (generalized): Secondary | ICD-10-CM | POA: Diagnosis not present

## 2021-07-09 DIAGNOSIS — M5459 Other low back pain: Secondary | ICD-10-CM | POA: Diagnosis not present

## 2021-07-09 DIAGNOSIS — R262 Difficulty in walking, not elsewhere classified: Secondary | ICD-10-CM | POA: Diagnosis not present

## 2021-07-15 DIAGNOSIS — R262 Difficulty in walking, not elsewhere classified: Secondary | ICD-10-CM | POA: Diagnosis not present

## 2021-07-15 DIAGNOSIS — M6281 Muscle weakness (generalized): Secondary | ICD-10-CM | POA: Diagnosis not present

## 2021-07-15 DIAGNOSIS — M5459 Other low back pain: Secondary | ICD-10-CM | POA: Diagnosis not present

## 2021-07-17 DIAGNOSIS — M5459 Other low back pain: Secondary | ICD-10-CM | POA: Diagnosis not present

## 2021-07-17 DIAGNOSIS — M6281 Muscle weakness (generalized): Secondary | ICD-10-CM | POA: Diagnosis not present

## 2021-07-17 DIAGNOSIS — R262 Difficulty in walking, not elsewhere classified: Secondary | ICD-10-CM | POA: Diagnosis not present

## 2021-07-26 ENCOUNTER — Other Ambulatory Visit (HOSPITAL_COMMUNITY): Payer: Self-pay

## 2021-07-30 DIAGNOSIS — M6281 Muscle weakness (generalized): Secondary | ICD-10-CM | POA: Diagnosis not present

## 2021-07-30 DIAGNOSIS — M5459 Other low back pain: Secondary | ICD-10-CM | POA: Diagnosis not present

## 2021-07-30 DIAGNOSIS — R262 Difficulty in walking, not elsewhere classified: Secondary | ICD-10-CM | POA: Diagnosis not present

## 2021-08-04 DIAGNOSIS — M5459 Other low back pain: Secondary | ICD-10-CM | POA: Diagnosis not present

## 2021-08-04 DIAGNOSIS — M6281 Muscle weakness (generalized): Secondary | ICD-10-CM | POA: Diagnosis not present

## 2021-08-04 DIAGNOSIS — R262 Difficulty in walking, not elsewhere classified: Secondary | ICD-10-CM | POA: Diagnosis not present

## 2021-08-06 DIAGNOSIS — M6281 Muscle weakness (generalized): Secondary | ICD-10-CM | POA: Diagnosis not present

## 2021-08-06 DIAGNOSIS — R262 Difficulty in walking, not elsewhere classified: Secondary | ICD-10-CM | POA: Diagnosis not present

## 2021-08-06 DIAGNOSIS — M5459 Other low back pain: Secondary | ICD-10-CM | POA: Diagnosis not present

## 2021-08-14 DIAGNOSIS — R262 Difficulty in walking, not elsewhere classified: Secondary | ICD-10-CM | POA: Diagnosis not present

## 2021-08-14 DIAGNOSIS — M6281 Muscle weakness (generalized): Secondary | ICD-10-CM | POA: Diagnosis not present

## 2021-08-14 DIAGNOSIS — M5459 Other low back pain: Secondary | ICD-10-CM | POA: Diagnosis not present

## 2021-08-21 DIAGNOSIS — M5459 Other low back pain: Secondary | ICD-10-CM | POA: Diagnosis not present

## 2021-08-21 DIAGNOSIS — R262 Difficulty in walking, not elsewhere classified: Secondary | ICD-10-CM | POA: Diagnosis not present

## 2021-08-21 DIAGNOSIS — M6281 Muscle weakness (generalized): Secondary | ICD-10-CM | POA: Diagnosis not present

## 2021-08-26 DIAGNOSIS — M5459 Other low back pain: Secondary | ICD-10-CM | POA: Diagnosis not present

## 2021-08-26 DIAGNOSIS — M6281 Muscle weakness (generalized): Secondary | ICD-10-CM | POA: Diagnosis not present

## 2021-08-26 DIAGNOSIS — R262 Difficulty in walking, not elsewhere classified: Secondary | ICD-10-CM | POA: Diagnosis not present

## 2021-08-28 DIAGNOSIS — M6281 Muscle weakness (generalized): Secondary | ICD-10-CM | POA: Diagnosis not present

## 2021-08-28 DIAGNOSIS — R262 Difficulty in walking, not elsewhere classified: Secondary | ICD-10-CM | POA: Diagnosis not present

## 2021-08-28 DIAGNOSIS — M5459 Other low back pain: Secondary | ICD-10-CM | POA: Diagnosis not present

## 2021-10-14 DIAGNOSIS — Z6841 Body Mass Index (BMI) 40.0 and over, adult: Secondary | ICD-10-CM | POA: Diagnosis not present

## 2021-10-14 DIAGNOSIS — I10 Essential (primary) hypertension: Secondary | ICD-10-CM | POA: Diagnosis not present

## 2021-10-24 ENCOUNTER — Other Ambulatory Visit: Payer: Self-pay | Admitting: Family Medicine

## 2021-10-24 ENCOUNTER — Other Ambulatory Visit (HOSPITAL_COMMUNITY): Payer: Self-pay

## 2021-10-24 ENCOUNTER — Other Ambulatory Visit: Payer: Self-pay

## 2021-10-24 MED FILL — Lisinopril Tab 20 MG: ORAL | 90 days supply | Qty: 90 | Fill #0 | Status: AC

## 2021-11-10 ENCOUNTER — Other Ambulatory Visit (HOSPITAL_COMMUNITY): Payer: Self-pay

## 2021-12-04 ENCOUNTER — Other Ambulatory Visit (HOSPITAL_COMMUNITY): Payer: Self-pay

## 2021-12-04 ENCOUNTER — Other Ambulatory Visit: Payer: Self-pay | Admitting: Family Medicine

## 2021-12-05 NOTE — Telephone Encounter (Signed)
Sent message to schedule appointment 12/05/21

## 2021-12-06 ENCOUNTER — Other Ambulatory Visit (HOSPITAL_COMMUNITY): Payer: Self-pay

## 2021-12-11 ENCOUNTER — Other Ambulatory Visit (HOSPITAL_COMMUNITY): Payer: Self-pay

## 2021-12-30 ENCOUNTER — Other Ambulatory Visit: Payer: Self-pay

## 2021-12-30 ENCOUNTER — Ambulatory Visit: Payer: 59 | Admitting: Family Medicine

## 2021-12-30 ENCOUNTER — Other Ambulatory Visit (HOSPITAL_COMMUNITY): Payer: Self-pay

## 2021-12-30 VITALS — BP 158/117 | HR 85 | Temp 98.8°F | Ht 75.0 in | Wt 388.8 lb

## 2021-12-30 DIAGNOSIS — E538 Deficiency of other specified B group vitamins: Secondary | ICD-10-CM | POA: Diagnosis not present

## 2021-12-30 DIAGNOSIS — Z130 Encounter for screening for diseases of the blood and blood-forming organs and certain disorders involving the immune mechanism: Secondary | ICD-10-CM | POA: Diagnosis not present

## 2021-12-30 DIAGNOSIS — I10 Essential (primary) hypertension: Secondary | ICD-10-CM | POA: Diagnosis not present

## 2021-12-30 DIAGNOSIS — M5126 Other intervertebral disc displacement, lumbar region: Secondary | ICD-10-CM | POA: Diagnosis not present

## 2021-12-30 DIAGNOSIS — J4530 Mild persistent asthma, uncomplicated: Secondary | ICD-10-CM | POA: Diagnosis not present

## 2021-12-30 DIAGNOSIS — E781 Pure hyperglyceridemia: Secondary | ICD-10-CM

## 2021-12-30 DIAGNOSIS — E559 Vitamin D deficiency, unspecified: Secondary | ICD-10-CM

## 2021-12-30 MED ORDER — LISINOPRIL 20 MG PO TABS
20.0000 mg | ORAL_TABLET | Freq: Every day | ORAL | 1 refills | Status: DC
  Filled 2021-12-30 – 2022-01-10 (×2): qty 90, 90d supply, fill #0
  Filled 2022-03-26: qty 90, 90d supply, fill #1

## 2021-12-30 MED ORDER — ALBUTEROL SULFATE HFA 108 (90 BASE) MCG/ACT IN AERS
2.0000 | INHALATION_SPRAY | Freq: Four times a day (QID) | RESPIRATORY_TRACT | 2 refills | Status: DC | PRN
  Filled 2021-12-30 – 2022-01-10 (×2): qty 18, 25d supply, fill #0
  Filled 2022-03-26: qty 18, 25d supply, fill #1
  Filled 2022-06-05 – 2022-09-12 (×2): qty 18, 25d supply, fill #2

## 2021-12-30 MED ORDER — NAPROXEN 500 MG PO TABS
500.0000 mg | ORAL_TABLET | Freq: Two times a day (BID) | ORAL | 3 refills | Status: AC | PRN
  Filled 2021-12-30 – 2022-01-10 (×2): qty 30, 15d supply, fill #0

## 2021-12-30 MED ORDER — METHOCARBAMOL 500 MG PO TABS
500.0000 mg | ORAL_TABLET | Freq: Three times a day (TID) | ORAL | 1 refills | Status: DC | PRN
  Filled 2021-12-30 – 2022-01-10 (×2): qty 90, 30d supply, fill #0
  Filled 2022-03-26: qty 90, 30d supply, fill #1

## 2021-12-30 MED ORDER — ADVAIR HFA 115-21 MCG/ACT IN AERO
2.0000 | INHALATION_SPRAY | Freq: Two times a day (BID) | RESPIRATORY_TRACT | 2 refills | Status: DC
  Filled 2021-12-30: qty 12, 30d supply, fill #0

## 2021-12-30 NOTE — Patient Instructions (Signed)
Please check your BP readings at home. If remains elevated >140/90 we need to make adjustments to your medications. ? ?Labs today. ? ?Follow up in 3 months regarding your BP. ?

## 2021-12-30 NOTE — Progress Notes (Signed)
? ?Subjective:  ?Patient ID: Randy Navarro, male    DOB: Dec 31, 1980  Age: 41 y.o. MRN: [id number] ? ?CC: ?Chief Complaint  ?Patient presents with  ? Establish Care  ? Medication Refill  ?  Needs refill on medications  ? ? ?HPI: ? ?41 year old male with morbid obesity, hypertension, asthma, chronic back pain presents for follow-up. ? ?Patient's hypertension is uncontrolled.  He states that his blood pressure seems to be elevated when he uses naproxen.  He states that his weight is likely playing a role as well.  He endorses compliance with lisinopril.  No reports of chest pain. ? ?Patient is currently just using albuterol as needed.  He has not been using his Advair in quite some time.  He states that he often has to use this in the spring and fall. ? ?Patient has chronic back pain.  Exacerbated by morbid obesity.  Uses naproxen as needed. ? ?Patient Active Problem List  ? Diagnosis Date Noted  ? Hypertriglyceridemia 12/30/2021  ? Prediabetes 03/19/2021  ? B12 deficiency 03/19/2021  ? Vitamin D deficiency 02/11/2021  ? Gastroesophageal reflux disease 02/11/2021  ? Morbid obesity ([place]) 01/07/2021  ? Essential hypertension 01/07/2021  ? Herniated lumbar intervertebral disc 01/07/2021  ? Mild persistent asthma 10/08/2014  ? ? ?Social Hx   ?Social History  ? ?Socioeconomic History  ? Marital status: Married  ?  Spouse name: Micai Apolinar  ? Number of children: 1  ? Years of education: Not on file  ? Highest education level: Not on file  ?Occupational History  ? Occupation: Animal nutritionist  ? Occupation: Social research officer, government  ?Tobacco Use  ? Smoking status: Former  ?  Packs/day: 0.10  ?  Types: Cigarettes  ? Smokeless tobacco: Current  ?  Types: Chew  ? Tobacco comments:  ?   about 20 yrs.  ?Vaping Use  ? Vaping Use: Never used  ?Substance and Sexual Activity  ? Alcohol use: Yes  ?  Alcohol/week: 0.0 standard drinks  ?  Comment: 4 beers per month  ? Drug use: Never  ? Sexual activity: Not on file  ?Other Topics Concern  ?  Not on file  ?Social History Narrative  ? Not on file  ? ?Social Determinants of Health  ? ?Financial Resource Strain: Not on file  ?Food Insecurity: Not on file  ?Transportation Needs: Not on file  ?Physical Activity: Not on file  ?Stress: Not on file  ?Social Connections: Not on file  ? ? ?Review of Systems ?Per HPI ? ?Objective:  ?BP (!) 158/117   Pulse 85   Temp 98.8 ?F (37.1 ?C) (Oral)   Ht 6' 3" (1.905 m)   Wt (!) 388 lb 12.8 oz (176.4 kg)   SpO2 97%   BMI 48.60 kg/m?  ? ?BP/Weight 12/30/2021 05/19/2021 05/07/2021  ?Systolic BP 923 300 762  ?Diastolic BP 263 86 70  ?Wt. (Lbs) 388.8 373 364  ?BMI 48.6 46.62 45.5  ? ? ?Physical Exam ?Vitals and nursing note reviewed.  ?Constitutional:   ?   General: He is not in acute distress. ?   Appearance: He is obese.  ?HENT:  ?   Head: Normocephalic and atraumatic.  ?Eyes:  ?   General:     ?   Right eye: No discharge.     ?   Left eye: No discharge.  ?   Conjunctiva/sclera: Conjunctivae normal.  ?Cardiovascular:  ?   Rate and Rhythm: Normal rate and regular rhythm.  ?Pulmonary:  ?  Effort: Pulmonary effort is normal.  ?   Breath sounds: Normal breath sounds. No wheezing, rhonchi or rales.  ?Neurological:  ?   Mental Status: He is alert.  ?Psychiatric:     ?   Mood and Affect: Mood normal.     ?   Behavior: Behavior normal.  ? ? ?Lab Results  ?Component Value Date  ? WBC 8.6 01/07/2021  ? HGB 15.1 01/07/2021  ? HCT 45.0 01/07/2021  ? PLT 301 01/07/2021  ? GLUCOSE 101 (H) 01/07/2021  ? CHOL 149 01/07/2021  ? TRIG 155 (H) 01/07/2021  ? HDL 30 (L) 01/07/2021  ? [place] 92 01/07/2021  ? ALT 31 01/07/2021  ? AST 21 01/07/2021  ? NA 140 01/07/2021  ? K 4.6 01/07/2021  ? CL 100 01/07/2021  ? CREATININE 1.27 01/07/2021  ? BUN 11 01/07/2021  ? CO2 25 01/07/2021  ? TSH 1.420 02/11/2021  ? HGBA1C 5.8 (H) 02/11/2021  ? ? ? ?Assessment & Plan:  ? ?Problem List Items Addressed This Visit   ? ?  ? Cardiovascular and Mediastinum  ? Essential hypertension  ?  Uncontrolled.  Patient  endorses compliance with lisinopril.  Needs labs.  Labs ordered today.  Advised to monitor blood pressures at home.  He is to continue lisinopril.  Follow-up in 3 months. ?  ?  ? Relevant Medications  ? lisinopril (ZESTRIL) 20 MG tablet  ? Other Relevant Orders  ? CMP14+EGFR  ?  ? Respiratory  ? Mild persistent asthma  ?  Stable currently.  Continue albuterol as needed. ?  ?  ? Relevant Medications  ? fluticasone-salmeterol (ADVAIR HFA) 115-21 MCG/ACT inhaler  ? albuterol (VENTOLIN HFA) 108 (90 Base) MCG/ACT inhaler  ?  ? Musculoskeletal and Integument  ? Herniated lumbar intervertebral disc  ?  Continue as needed naproxen. ?  ?  ?  ? Other  ? Vitamin D deficiency - Primary  ? Relevant Orders  ? Vitamin D (25 hydroxy)  ? B12 deficiency  ? Relevant Orders  ? Vitamin B12  ? Hypertriglyceridemia  ? Relevant Medications  ? lisinopril (ZESTRIL) 20 MG tablet  ? Other Relevant Orders  ? Lipid panel  ? ?Other Visit Diagnoses   ? ? Screening for deficiency anemia      ? Relevant Orders  ? CBC  ? ?  ? ? ?Meds ordered this encounter  ?Medications  ? lisinopril (ZESTRIL) 20 MG tablet  ?  Sig: Take 1 tablet (20 mg total) by mouth daily.  ?  Dispense:  90 tablet  ?  Refill:  1  ? naproxen (NAPROSYN) 500 MG tablet  ?  Sig: Take 1 tablet (500 mg total) by mouth 2 (two) times daily as needed for moderate pain.  ?  Dispense:  30 tablet  ?  Refill:  3  ? methocarbamol (ROBAXIN) 500 MG tablet  ?  Sig: Take 1 tablet (500 mg total) by mouth every 8 (eight) hours as needed for muscle spasms.  ?  Dispense:  90 tablet  ?  Refill:  1  ? fluticasone-salmeterol (ADVAIR HFA) 115-21 MCG/ACT inhaler  ?  Sig: Inhale 2 puffs into the lungs 2 (two) times daily.  ?  Dispense:  12 g  ?  Refill:  2  ? albuterol (VENTOLIN HFA) 108 (90 Base) MCG/ACT inhaler  ?  Sig: INHALE 2 PUFFS INTO THE LUNGS EVERY 6 HOURS AS NEEDED FOR WHEEZING OR SHORTNESS OF BREATH.  ?  Dispense:  18 g  ?  Refill:  2  ? ? ?Follow-up:  Return in about 3 months (around 04/01/2022) for  HTN follow up. ? ?Thersa Salt DO ?[place] ? ?

## 2021-12-30 NOTE — Assessment & Plan Note (Signed)
Uncontrolled.  Patient endorses compliance with lisinopril.  Needs labs.  Labs ordered today.  Advised to monitor blood pressures at home.  He is to continue lisinopril.  Follow-up in 3 months. ?

## 2021-12-30 NOTE — Assessment & Plan Note (Signed)
Continue as needed naproxen. ?

## 2021-12-30 NOTE — Assessment & Plan Note (Signed)
Stable currently.  Continue albuterol as needed. ?

## 2021-12-31 ENCOUNTER — Other Ambulatory Visit (HOSPITAL_COMMUNITY): Payer: Self-pay

## 2021-12-31 LAB — CBC
Hematocrit: 46.7 % (ref 37.5–51.0)
Hemoglobin: 15.2 g/dL (ref 13.0–17.7)
MCH: 26.9 pg (ref 26.6–33.0)
MCHC: 32.5 g/dL (ref 31.5–35.7)
MCV: 83 fL (ref 79–97)
Platelets: 305 10*3/uL (ref 150–450)
RBC: 5.65 x10E6/uL (ref 4.14–5.80)
RDW: 13.7 % (ref 11.6–15.4)
WBC: 8.8 10*3/uL (ref 3.4–10.8)

## 2021-12-31 LAB — VITAMIN D 25 HYDROXY (VIT D DEFICIENCY, FRACTURES): Vit D, 25-Hydroxy: 25.9 ng/mL — ABNORMAL LOW (ref 30.0–100.0)

## 2021-12-31 LAB — CMP14+EGFR
ALT: 18 IU/L (ref 0–44)
AST: 17 IU/L (ref 0–40)
Albumin/Globulin Ratio: 1.8 (ref 1.2–2.2)
Albumin: 4.6 g/dL (ref 4.0–5.0)
Alkaline Phosphatase: 82 IU/L (ref 44–121)
BUN/Creatinine Ratio: 11 (ref 9–20)
BUN: 12 mg/dL (ref 6–20)
Bilirubin Total: 0.4 mg/dL (ref 0.0–1.2)
CO2: 25 mmol/L (ref 20–29)
Calcium: 9.7 mg/dL (ref 8.7–10.2)
Chloride: 99 mmol/L (ref 96–106)
Creatinine, Ser: 1.07 mg/dL (ref 0.76–1.27)
Globulin, Total: 2.5 g/dL (ref 1.5–4.5)
Glucose: 92 mg/dL (ref 70–99)
Potassium: 4.5 mmol/L (ref 3.5–5.2)
Sodium: 140 mmol/L (ref 134–144)
Total Protein: 7.1 g/dL (ref 6.0–8.5)
eGFR: 91 mL/min/{1.73_m2} (ref >59)

## 2021-12-31 LAB — LIPID PANEL
Chol/HDL Ratio: 5.4 ratio — ABNORMAL HIGH (ref 0.0–5.0)
Cholesterol, Total: 185 mg/dL (ref 100–199)
HDL: 34 mg/dL — ABNORMAL LOW (ref >39)
LDL Chol Calc (NIH): 113 mg/dL — ABNORMAL HIGH (ref 0–99)
Triglycerides: 218 mg/dL — ABNORMAL HIGH (ref 0–149)
VLDL Cholesterol Cal: 38 mg/dL (ref 5–40)

## 2021-12-31 LAB — VITAMIN B12: Vitamin B-12: 980 pg/mL (ref 232–1245)

## 2022-01-07 ENCOUNTER — Other Ambulatory Visit (HOSPITAL_COMMUNITY): Payer: Self-pay

## 2022-01-10 ENCOUNTER — Other Ambulatory Visit (HOSPITAL_COMMUNITY): Payer: Self-pay

## 2022-02-03 IMAGING — MR MR LUMBAR SPINE W/O CM
4 of 5 series · 22 of 48 positions shown · non-contrast
Comparison: MRI of the lumbar spine June 23, 19

CLINICAL DATA: Chronic low back pain and bilateral leg pain.

EXAM:
MRI LUMBAR SPINE WITHOUT CONTRAST
TECHNIQUE: Multiplanar, multisequence MR imaging of the lumbar spine was
performed. No intravenous contrast was administered.

[Series 5: T2 · sagittal · 4.0mm · 0.88mm/px · 6 of 17 slices shown (1 of 2)]
[im 1/17]
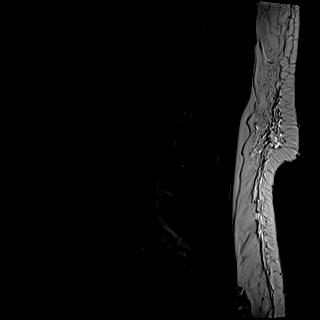
[im 4/17]
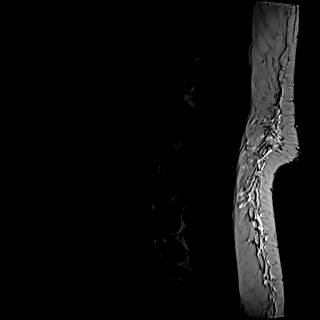
[im 7/17]
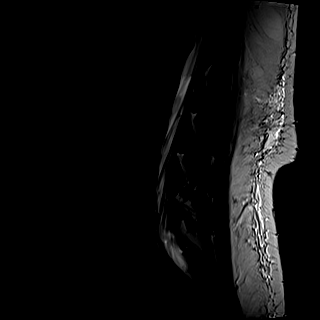
[im 10/17]
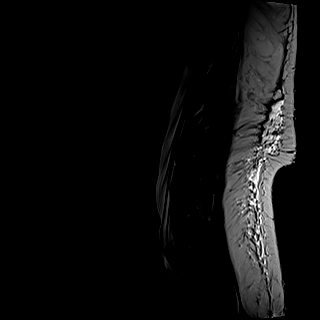
[im 13/17]
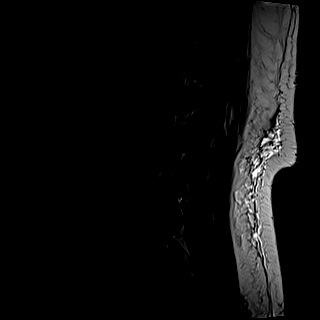
[im 17/17]
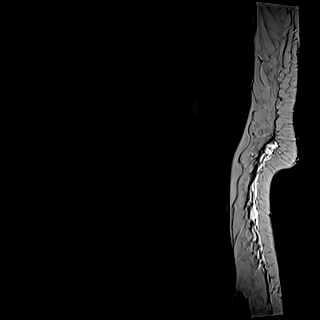

[Series 6: T1 · sagittal · 4.0mm · 0.88mm/px · 4 of 17 slices shown (1 of 2)]
[im 1/17]
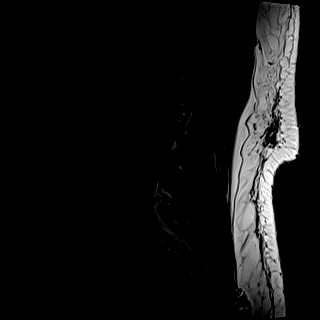
[im 4/17]
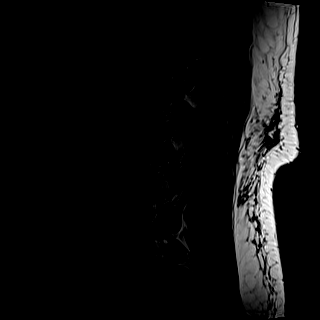
[im 10/17]
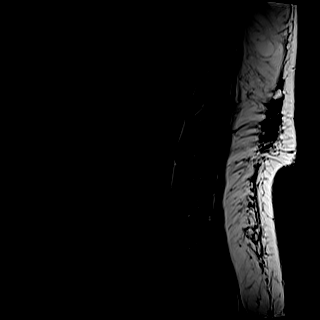
[im 17/17]
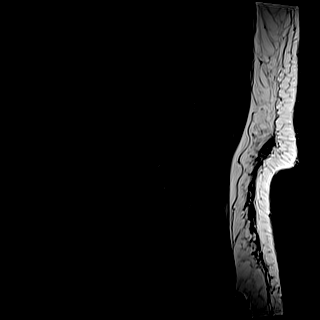

[Series 10: T2 · axial · 4.0mm · 0.35mm/px · z∈[-12,+229]mm · 9 of 46 slices shown (2 of 2)]
[im 1/46]
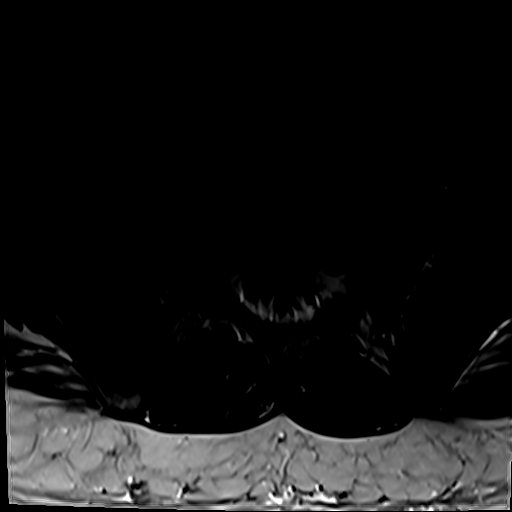
[im 7/46]
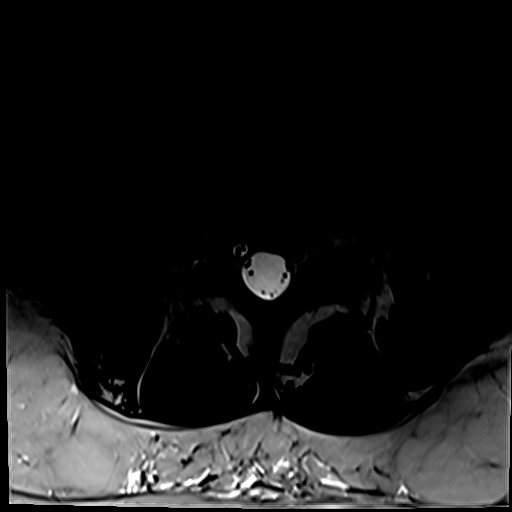
[im 13/46]
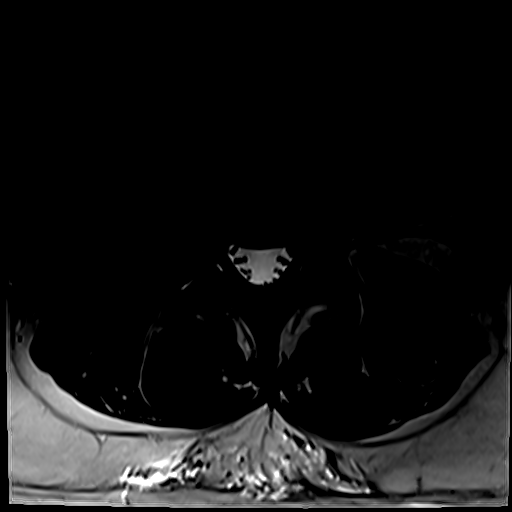
[im 20/46]
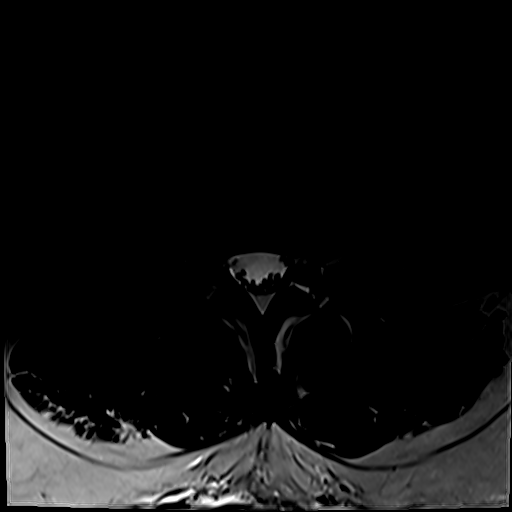
[im 23/46]
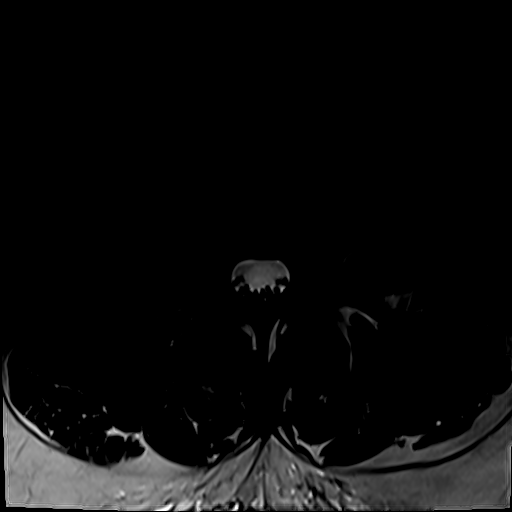
[im 26/46]
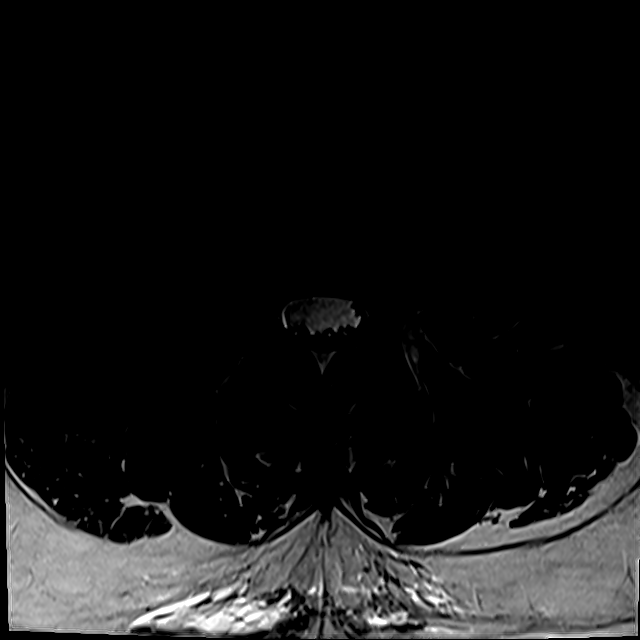
[im 33/46]
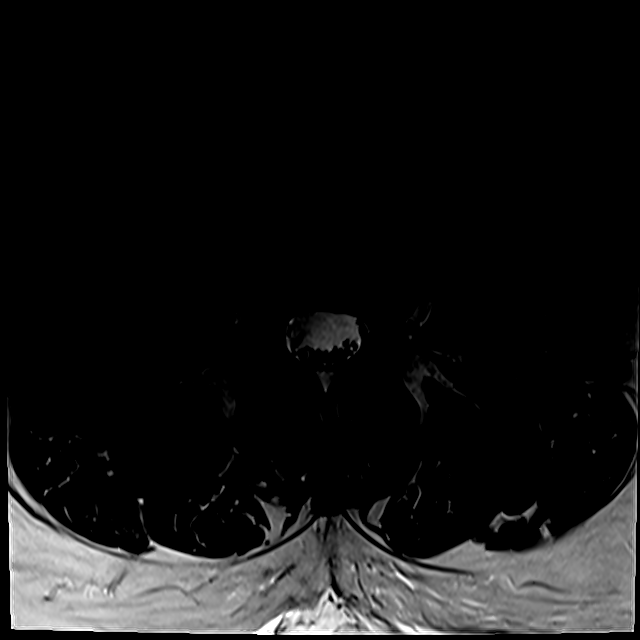
[im 39/46]
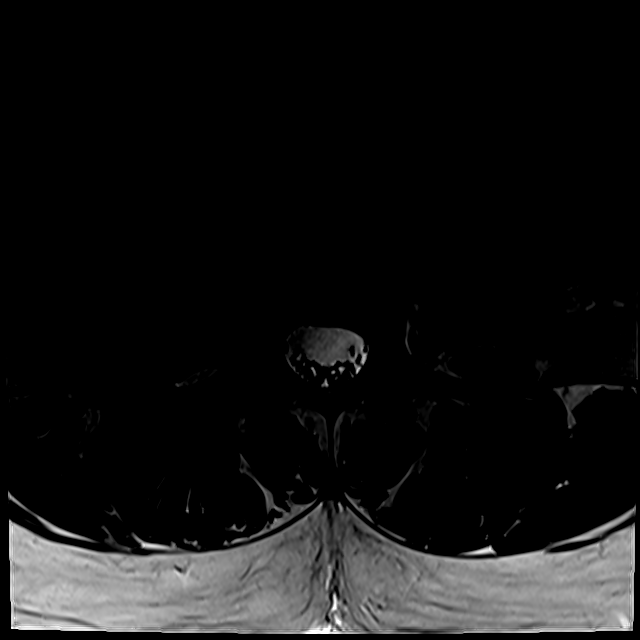
[im 46/46]
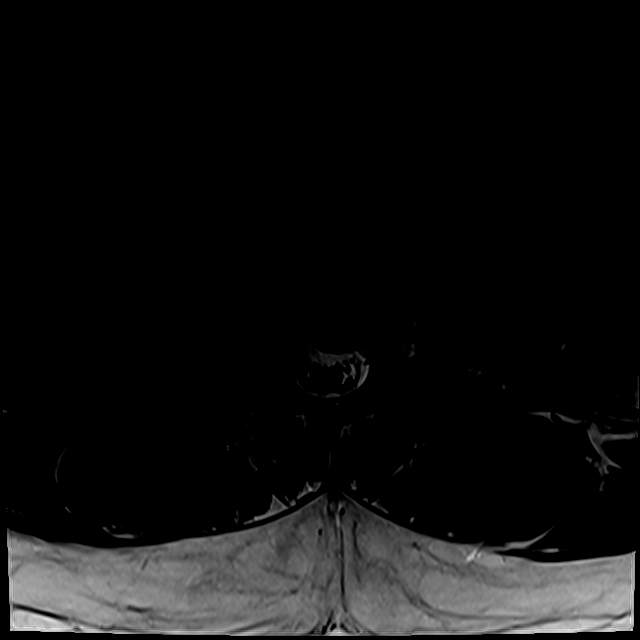

[Series 13: T1 · axial · 4.0mm · 0.28mm/px · z∈[+17,+195]mm · 3 of 46 slices shown (2 of 2)]
[im 7/46]
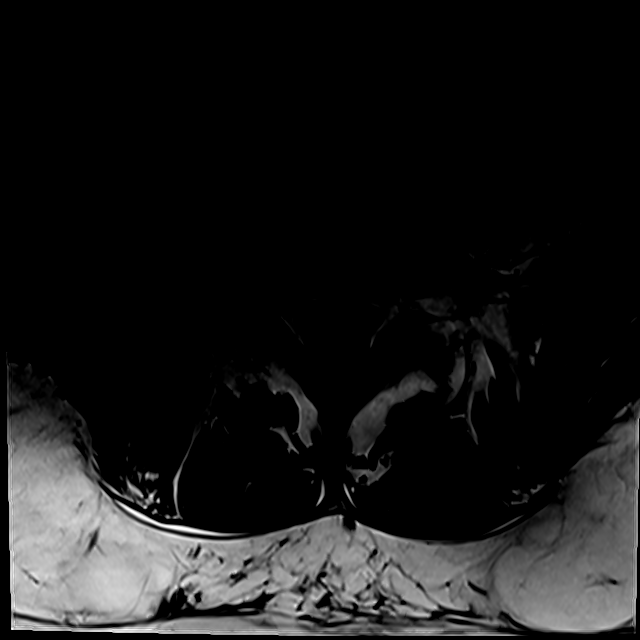
[im 23/46]
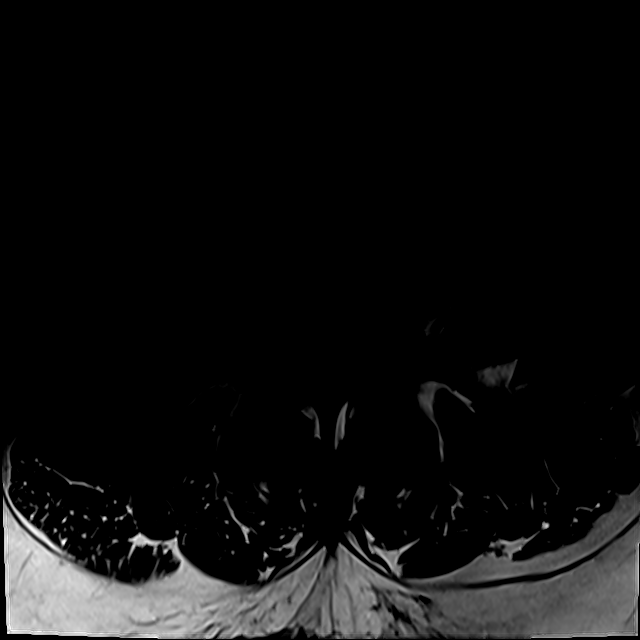
[im 39/46]
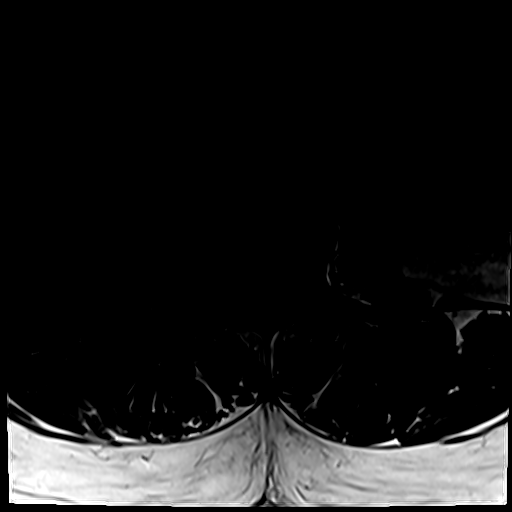

[22 of 48 positions shown; findings below may reference images not displayed]

FINDINGS: Segmentation:  Standard.

Alignment:  Physiologic.

Vertebrae:  No fracture, evidence of discitis, or bone lesion.

Conus medullaris and cauda equina: Conus extends to the L1 level.
Conus and cauda equina appear normal.

Paraspinal and other soft tissues: Negative.

Disc levels:

T11-12: Central disc protrusion causing indentation of the thecal
sac without significant spinal or neural foraminal stenosis,
unchanged.

T12-L1: Left central disc protrusion causing indentation of the
thecal sac without significant spinal canal or neural foraminal
stenosis. Unchanged. L1-2: No spinal canal or neural foraminal
stenosis.

L2-3: No spinal canal or neural foraminal stenosis.

L3-4: Unchanged mild facet degenerative changes with a posteriorly
projecting 5 mm synovial cyst on the right. No spinal canal or
neural foraminal stenosis.

L4-5: Mild loss of disc height and small central disc protrusion
causing indentation on the thecal sac, slightly more pronounced than
on prior MRI. Mild facet degenerative changes are stable.

L5-S1: No spinal canal or neural foraminal stenosis.
IMPRESSION: 1. Slight interval increase in size of small central disc protrusion
at L4-L5 causing indentation on the thecal sac without significant
spinal canal stenosis.
2. No significant spinal canal or neural foraminal stenosis at any
level.

## 2022-03-26 ENCOUNTER — Other Ambulatory Visit (HOSPITAL_COMMUNITY): Payer: Self-pay

## 2022-04-06 ENCOUNTER — Ambulatory Visit: Payer: Self-pay | Admitting: Family Medicine

## 2022-04-15 ENCOUNTER — Other Ambulatory Visit (HOSPITAL_COMMUNITY): Payer: Self-pay

## 2022-04-15 ENCOUNTER — Ambulatory Visit: Payer: 59 | Admitting: Family Medicine

## 2022-04-15 DIAGNOSIS — E782 Mixed hyperlipidemia: Secondary | ICD-10-CM | POA: Diagnosis not present

## 2022-04-15 DIAGNOSIS — J4530 Mild persistent asthma, uncomplicated: Secondary | ICD-10-CM

## 2022-04-15 DIAGNOSIS — I10 Essential (primary) hypertension: Secondary | ICD-10-CM

## 2022-04-15 MED ORDER — SEMAGLUTIDE-WEIGHT MANAGEMENT 0.25 MG/0.5ML ~~LOC~~ SOAJ
0.2500 mg | SUBCUTANEOUS | 0 refills | Status: DC
  Filled 2022-04-15: qty 2, 28d supply, fill #0

## 2022-04-15 NOTE — Assessment & Plan Note (Signed)
Rx sent for Wegovy

## 2022-04-15 NOTE — Progress Notes (Signed)
Subjective:  Patient ID: Randy Navarro, male    DOB: 06/27/1981  Age: 41 y.o. MRN: [id number]  CC: Chief Complaint  Patient presents with   Hypertension    Follow up    HPI:  41 year old male with asthma, hypertension, morbid obesity, mixed hyperlipidemia presents for follow-up.  Hypertension stable on lisinopril.  Patient is interested in pharmacotherapy regarding his obesity.  He states that his wife is on [facility] and is doing well.  He is interested in this medication today.  Asthma currently stable.  Patient Active Problem List   Diagnosis Date Noted   Mixed hyperlipidemia 04/15/2022   Prediabetes 03/19/2021   B12 deficiency 03/19/2021   Vitamin D deficiency 02/11/2021   Gastroesophageal reflux disease 02/11/2021   Morbid obesity (HCC) 01/07/2021   Essential hypertension 01/07/2021   Herniated lumbar intervertebral disc 01/07/2021   Mild persistent asthma 10/08/2014    Social Hx   Social History   Socioeconomic History   Marital status: Married    Spouse name: Engineer, agricultural   Number of children: 1   Years of education: Not on file   Highest education level: Not on file  Occupational History   Occupation: Engineer, materials   Occupation: Engineer, water  Tobacco Use   Smoking status: Former    Packs/day: 0.10    Types: Cigarettes   Smokeless tobacco: Current    Types: Chew   Tobacco comments:     about 20 yrs.  Vaping Use   Vaping Use: Never used  Substance and Sexual Activity   Alcohol use: Yes    Alcohol/week: 0.0 standard drinks of alcohol    Comment: 4 beers per month   Drug use: Never   Sexual activity: Not on file  Other Topics Concern   Not on file  Social History Narrative   Not on file   Social Determinants of Health   Financial Resource Strain: Not on file  Food Insecurity: Not on file  Transportation Needs: Not on file  Physical Activity: Not on file  Stress: Not on file  Social Connections: Not on file    Review of  Systems Per HPI  Objective:  BP 135/83   Pulse 84   Temp 97.9 F (36.6 C) (Oral)   Ht 6\' 3"  (1.905 m)   Wt (!) 392 lb 9.6 oz (178.1 kg)   SpO2 98%   BMI 49.07 kg/m      04/15/2022   10:51 AM 04/15/2022   10:18 AM 12/30/2021    8:28 AM  BP/Weight  Systolic BP 135 149 158  Diastolic BP 83 106 117  Wt. (Lbs)  392.6 388.8  BMI  49.07 kg/m2 48.6 kg/m2    Physical Exam Constitutional:      General: He is not in acute distress.    Appearance: Normal appearance.  HENT:     Head: Normocephalic and atraumatic.  Eyes:     General:        Right eye: No discharge.        Left eye: No discharge.     Conjunctiva/sclera: Conjunctivae normal.  Cardiovascular:     Rate and Rhythm: Normal rate and regular rhythm.  Pulmonary:     Effort: Pulmonary effort is normal.     Breath sounds: Normal breath sounds.  Neurological:     Mental Status: He is alert.  Psychiatric:        Mood and Affect: Mood normal.        Behavior: Behavior normal.  Lab Results  Component Value Date   WBC 8.8 12/30/2021   HGB 15.2 12/30/2021   HCT 46.7 12/30/2021   PLT 305 12/30/2021   GLUCOSE 92 12/30/2021   CHOL 185 12/30/2021   TRIG 218 (H) 12/30/2021   HDL 34 (L) 12/30/2021   LDLCALC 113 (H) 12/30/2021   ALT 18 12/30/2021   AST 17 12/30/2021   NA 140 12/30/2021   K 4.5 12/30/2021   CL 99 12/30/2021   CREATININE 1.07 12/30/2021   BUN 12 12/30/2021   CO2 25 12/30/2021   TSH 1.420 02/11/2021   HGBA1C 5.8 (H) 02/11/2021     Assessment & Plan:   Problem List Items Addressed This Visit       Cardiovascular and Mediastinum   Essential hypertension    Stable.  Continue lisinopril.        Respiratory   Mild persistent asthma    Stable.  Continue Advair and albuterol.        Other   Morbid obesity (HCC)    Rx sent for [facility]      Relevant Medications   Semaglutide-Weight Management 0.25 MG/0.5ML SOAJ   Mixed hyperlipidemia    Meds ordered this encounter  Medications    Semaglutide-Weight Management 0.25 MG/0.5ML SOAJ    Sig: Inject 0.25 mg into the skin once a week.    Dispense:  2 mL    Refill:  0    Follow-up:  Return in about 6 months (around 10/16/2022).  Everlene Other DO [facility]

## 2022-04-15 NOTE — Assessment & Plan Note (Signed)
Stable. Continue lisinopril

## 2022-04-15 NOTE — Patient Instructions (Signed)
We will see if Randy Navarro is available.  Continue your current medications.   Follow up in 6 months.

## 2022-04-15 NOTE — Assessment & Plan Note (Signed)
Stable.  Continue Advair and albuterol. 

## 2022-04-16 DIAGNOSIS — H5203 Hypermetropia, bilateral: Secondary | ICD-10-CM | POA: Diagnosis not present

## 2022-04-17 ENCOUNTER — Other Ambulatory Visit (HOSPITAL_COMMUNITY): Payer: Self-pay

## 2022-04-23 ENCOUNTER — Telehealth: Payer: Self-pay | Admitting: Family Medicine

## 2022-04-23 NOTE — Telephone Encounter (Signed)
Fax from insurance stating that Randy Navarro has been denied. Denied due to not meeting guideline rules for the requested drug. Pt has to be actively enrolled in a medically supervised weight loss program. Please advise. Thank you

## 2022-05-20 ENCOUNTER — Encounter (INDEPENDENT_AMBULATORY_CARE_PROVIDER_SITE_OTHER): Payer: Self-pay

## 2022-05-29 ENCOUNTER — Other Ambulatory Visit (HOSPITAL_COMMUNITY): Payer: Self-pay

## 2022-06-05 ENCOUNTER — Other Ambulatory Visit (HOSPITAL_COMMUNITY): Payer: Self-pay

## 2022-06-05 ENCOUNTER — Other Ambulatory Visit: Payer: Self-pay | Admitting: Family Medicine

## 2022-06-05 MED ORDER — LISINOPRIL 20 MG PO TABS
20.0000 mg | ORAL_TABLET | Freq: Every day | ORAL | 1 refills | Status: DC
Start: 2022-06-05 — End: 2023-01-19
  Filled 2022-06-05 – 2022-07-20 (×3): qty 90, 90d supply, fill #0
  Filled 2022-10-20: qty 90, 90d supply, fill #1

## 2022-06-05 MED ORDER — METHOCARBAMOL 500 MG PO TABS
500.0000 mg | ORAL_TABLET | Freq: Three times a day (TID) | ORAL | 0 refills | Status: DC | PRN
  Filled 2022-06-05: qty 90, 30d supply, fill #0

## 2022-06-05 MED ORDER — ALBUTEROL SULFATE HFA 108 (90 BASE) MCG/ACT IN AERS
INHALATION_SPRAY | RESPIRATORY_TRACT | 0 refills | Status: DC
  Filled 2022-06-05: qty 6.7, 25d supply, fill #0

## 2022-06-09 ENCOUNTER — Other Ambulatory Visit (HOSPITAL_COMMUNITY): Payer: Self-pay

## 2022-06-25 ENCOUNTER — Other Ambulatory Visit (HOSPITAL_COMMUNITY): Payer: Self-pay

## 2022-06-26 ENCOUNTER — Other Ambulatory Visit (HOSPITAL_COMMUNITY): Payer: Self-pay

## 2022-07-14 ENCOUNTER — Ambulatory Visit: Payer: 59 | Admitting: Family Medicine

## 2022-07-14 ENCOUNTER — Encounter: Payer: Self-pay | Admitting: Family Medicine

## 2022-07-14 DIAGNOSIS — J0100 Acute maxillary sinusitis, unspecified: Secondary | ICD-10-CM

## 2022-07-14 DIAGNOSIS — J329 Chronic sinusitis, unspecified: Secondary | ICD-10-CM | POA: Insufficient documentation

## 2022-07-14 MED ORDER — AMOXICILLIN-POT CLAVULANATE 875-125 MG PO TABS: 1.0000 | ORAL_TABLET | Freq: Two times a day (BID) | ORAL | 0 refills | Status: DC

## 2022-07-14 NOTE — Assessment & Plan Note (Signed)
Treating with Augmentin. 

## 2022-07-14 NOTE — Progress Notes (Signed)
Subjective:  Patient ID: Randy Navarro, male    DOB: Nov 29, 1980  Age: 41 y.o. MRN: [id number]  CC: Chief Complaint  Patient presents with   Cough    Pt arrives with congestion, drainage, cough and sore throat. Began about 5-7 days ago.     HPI:  41 year old male presents for evaluation of the above.  Patient reports that he has been sick for the past 5 days.  Reports nasal congestion and drainage.  Also reports cough and sore throat.  No sick contacts.  No fever.  No relief with over-the-counter treatment.  Has a history of sinusitis.  No other associated symptoms.  No other complaints.  Patient Active Problem List   Diagnosis Date Noted   Sinusitis 07/14/2022   Mixed hyperlipidemia 04/15/2022   Prediabetes 03/19/2021   B12 deficiency 03/19/2021   Vitamin D deficiency 02/11/2021   Gastroesophageal reflux disease 02/11/2021   Morbid obesity ([place]) 01/07/2021   Essential hypertension 01/07/2021   Herniated lumbar intervertebral disc 01/07/2021   Mild persistent asthma 10/08/2014    Social Hx   Social History   Socioeconomic History   Marital status: Married    Spouse name: Solicitor   Number of children: 1   Years of education: Not on file   Highest education level: Not on file  Occupational History   Occupation: Animal nutritionist   Occupation: Social research officer, government  Tobacco Use   Smoking status: Former    Packs/day: 0.10    Types: Cigarettes   Smokeless tobacco: Current    Types: Chew   Tobacco comments:     about 20 yrs.  Vaping Use   Vaping Use: Never used  Substance and Sexual Activity   Alcohol use: Yes    Alcohol/week: 0.0 standard drinks of alcohol    Comment: 4 beers per month   Drug use: Never   Sexual activity: Not on file  Other Topics Concern   Not on file  Social History Narrative   Not on file   Social Determinants of Health   Financial Resource Strain: Not on file  Food Insecurity: Not on file  Transportation Needs: Not on file   Physical Activity: Not on file  Stress: Not on file  Social Connections: Not on file    Review of Systems Per HPI  Objective:  BP (!) 140/87   Pulse 89   Temp 97.9 F (36.6 C)   Wt (!) 409 lb 8 oz (185.7 kg)   SpO2 95%   BMI 51.18 kg/m      07/14/2022    4:06 PM 07/14/2022    3:49 PM 07/14/2022    3:46 PM  BP/Weight  Systolic BP 301 601 093  Diastolic BP 87 235 573  Wt. (Lbs)   409.5  BMI   51.18 kg/m2    Physical Exam Vitals and nursing note reviewed.  Constitutional:      General: He is not in acute distress.    Appearance: He is obese.  HENT:     Head: Normocephalic and atraumatic.     Right Ear: Tympanic membrane normal.     Left Ear: Tympanic membrane normal.     Nose: Congestion present.     Mouth/Throat:     Pharynx: Oropharynx is clear.  Eyes:     General:        Right eye: No discharge.        Left eye: No discharge.     Conjunctiva/sclera: Conjunctivae normal.  Cardiovascular:  Rate and Rhythm: Normal rate and regular rhythm.  Pulmonary:     Effort: Pulmonary effort is normal.     Breath sounds: Normal breath sounds. No wheezing, rhonchi or rales.  Neurological:     Mental Status: He is alert.  Psychiatric:        Mood and Affect: Mood normal.        Behavior: Behavior normal.     Lab Results  Component Value Date   WBC 8.8 12/30/2021   HGB 15.2 12/30/2021   HCT 46.7 12/30/2021   PLT 305 12/30/2021   GLUCOSE 92 12/30/2021   CHOL 185 12/30/2021   TRIG 218 (H) 12/30/2021   HDL 34 (L) 12/30/2021   LDLCALC 113 (H) 12/30/2021   ALT 18 12/30/2021   AST 17 12/30/2021   NA 140 12/30/2021   K 4.5 12/30/2021   CL 99 12/30/2021   CREATININE 1.07 12/30/2021   BUN 12 12/30/2021   CO2 25 12/30/2021   TSH 1.420 02/11/2021   HGBA1C 5.8 (H) 02/11/2021     Assessment & Plan:   Problem List Items Addressed This Visit       Respiratory   Sinusitis    Treating with Augmentin.       Relevant Medications   amoxicillin-clavulanate  (AUGMENTIN) 875-125 MG tablet    Meds ordered this encounter  Medications   amoxicillin-clavulanate (AUGMENTIN) 875-125 MG tablet    Sig: Take 1 tablet by mouth 2 (two) times daily.    Dispense:  20 tablet    Refill:  0   Tyreonna Czaplicki DO [facility]

## 2022-07-14 NOTE — Patient Instructions (Signed)
Antibiotic as prescribed.  Watch BP.  Take care  Dr. Lacinda Axon

## 2022-07-20 ENCOUNTER — Other Ambulatory Visit (HOSPITAL_COMMUNITY): Payer: Self-pay

## 2022-09-12 ENCOUNTER — Other Ambulatory Visit (HOSPITAL_COMMUNITY): Payer: Self-pay

## 2022-09-12 ENCOUNTER — Other Ambulatory Visit: Payer: Self-pay | Admitting: Family Medicine

## 2022-09-14 ENCOUNTER — Other Ambulatory Visit (HOSPITAL_COMMUNITY): Payer: Self-pay

## 2022-09-14 MED ORDER — METHOCARBAMOL 500 MG PO TABS
500.0000 mg | ORAL_TABLET | Freq: Three times a day (TID) | ORAL | 0 refills | Status: DC | PRN
  Filled 2022-09-14: qty 90, 30d supply, fill #0

## 2022-09-16 ENCOUNTER — Other Ambulatory Visit (HOSPITAL_COMMUNITY): Payer: Self-pay

## 2022-09-17 ENCOUNTER — Other Ambulatory Visit (HOSPITAL_COMMUNITY): Payer: Self-pay

## 2022-10-19 ENCOUNTER — Ambulatory Visit: Payer: 59 | Admitting: Family Medicine

## 2022-10-21 ENCOUNTER — Other Ambulatory Visit (HOSPITAL_COMMUNITY): Payer: Self-pay

## 2022-10-22 ENCOUNTER — Other Ambulatory Visit (HOSPITAL_COMMUNITY): Payer: Self-pay

## 2022-10-23 ENCOUNTER — Other Ambulatory Visit (HOSPITAL_COMMUNITY): Payer: Self-pay

## 2022-10-23 ENCOUNTER — Ambulatory Visit (INDEPENDENT_AMBULATORY_CARE_PROVIDER_SITE_OTHER): Payer: 59 | Admitting: Family Medicine

## 2022-10-23 VITALS — BP 136/80 | HR 88 | Temp 98.2°F | Ht 75.0 in | Wt >= 6400 oz

## 2022-10-23 DIAGNOSIS — M5126 Other intervertebral disc displacement, lumbar region: Secondary | ICD-10-CM | POA: Diagnosis not present

## 2022-10-23 DIAGNOSIS — I10 Essential (primary) hypertension: Secondary | ICD-10-CM

## 2022-10-23 DIAGNOSIS — J069 Acute upper respiratory infection, unspecified: Secondary | ICD-10-CM | POA: Insufficient documentation

## 2022-10-23 MED ORDER — ZEPBOUND 2.5 MG/0.5ML ~~LOC~~ SOAJ
2.5000 mg | SUBCUTANEOUS | 0 refills | Status: AC
Start: 2022-10-23 — End: ?
  Filled 2022-10-23: qty 2, 28d supply, fill #0

## 2022-10-23 NOTE — Assessment & Plan Note (Signed)
Patient okay with continued use of over-the-counter treatment.  Supportive care.

## 2022-10-23 NOTE — Assessment & Plan Note (Signed)
Starting Zepbound.

## 2022-10-23 NOTE — Assessment & Plan Note (Signed)
Advised against use of oxycodone.  Recommended follow-up with neurosurgery.

## 2022-10-23 NOTE — Patient Instructions (Signed)
Continue your medication.  Follow up in 6 months.  If sinus stuff continues, let me know.  Take care  Dr. Lacinda Axon

## 2022-10-23 NOTE — Assessment & Plan Note (Signed)
Well-controlled.  Continue lisinopril. 

## 2022-10-23 NOTE — Progress Notes (Signed)
Subjective:  Patient ID: Randy Navarro, male    DOB: [birth year]  Age: 42 y.o. MRN: [id number]  CC: Chief Complaint  Patient presents with   Follow-up    Patient would like to discuss pain management for his back    HPI:  42 year old male with hypertension, asthma, GERD, morbid obesity, chronic low back pain, hyperlipidemia presents for follow-up.  Hypertension is stable on lisinopril.  Patient reports that he has recently had a cold.  Has been going on for the last 2 to 3 days.  He reports congestion and drainage.  No sore throat.  No fever.  He states that he seems to be improving with over-the-counter treatment.  I have previously prescribed [facility] for him but was not available.  He is interested in alternative weight loss medication.  Patient reports continued low back pain.  Does okay for the most part.  States that he sees neurosurgery approximately once a year.  Uses oxycodone intermittently.  He would like to discuss this today.  Patient Active Problem List   Diagnosis Date Noted   URI (upper respiratory infection) 10/23/2022   Mixed hyperlipidemia 04/15/2022   Prediabetes 03/19/2021   Gastroesophageal reflux disease 02/11/2021   Morbid obesity ([place]) 01/07/2021   Essential hypertension 01/07/2021   Herniated lumbar intervertebral disc 01/07/2021   Mild persistent asthma 10/08/2014    Social Hx   Social History   Socioeconomic History   Marital status: Married    Spouse name: Solicitor   Number of children: 1   Years of education: Not on file   Highest education level: Not on file  Occupational History   Occupation: Animal nutritionist   Occupation: Social research officer, government  Tobacco Use   Smoking status: Former    Packs/day: 0.10    Types: Cigarettes   Smokeless tobacco: Current    Types: Chew   Tobacco comments:     about 20 yrs.  Vaping Use   Vaping Use: Never used  Substance and Sexual Activity   Alcohol use: Yes    Alcohol/week: 0.0 standard drinks of  alcohol    Comment: 4 beers per month   Drug use: Never   Sexual activity: Not on file  Other Topics Concern   Not on file  Social History Narrative   Not on file   Social Determinants of Health   Financial Resource Strain: Not on file  Food Insecurity: Not on file  Transportation Needs: Not on file  Physical Activity: Not on file  Stress: Not on file  Social Connections: Not on file    Review of Systems Per HPI  Objective:  BP 136/80   Pulse 88   Temp 98.2 F (36.8 C) (Oral)   Ht 6\' 3"  (1.905 m)   Wt (!) 411 lb 12.8 oz (186.8 kg)   SpO2 98%   BMI 51.47 kg/m      10/23/2022    9:14 AM 07/14/2022    4:06 PM 07/14/2022    3:49 PM  BP/Weight  Systolic BP 627 035 009  Diastolic BP 80 87 381  Wt. (Lbs) 411.8    BMI 51.47 kg/m2      Physical Exam Vitals and nursing note reviewed.  Constitutional:      General: He is not in acute distress.    Appearance: Normal appearance. He is obese.  HENT:     Head: Normocephalic and atraumatic.     Nose: Congestion present.  Cardiovascular:     Rate and Rhythm: Normal rate  and regular rhythm.  Pulmonary:     Effort: Pulmonary effort is normal.     Breath sounds: Normal breath sounds. No wheezing, rhonchi or rales.  Neurological:     Mental Status: He is alert.     Lab Results  Component Value Date   WBC 8.8 12/30/2021   HGB 15.2 12/30/2021   HCT 46.7 12/30/2021   PLT 305 12/30/2021   GLUCOSE 92 12/30/2021   CHOL 185 12/30/2021   TRIG 218 (H) 12/30/2021   HDL 34 (L) 12/30/2021   LDLCALC 113 (H) 12/30/2021   ALT 18 12/30/2021   AST 17 12/30/2021   NA 140 12/30/2021   K 4.5 12/30/2021   CL 99 12/30/2021   CREATININE 1.07 12/30/2021   BUN 12 12/30/2021   CO2 25 12/30/2021   TSH 1.420 02/11/2021   HGBA1C 5.8 (H) 02/11/2021     Assessment & Plan:   Problem List Items Addressed This Visit       Cardiovascular and Mediastinum   Essential hypertension    Well-controlled.  Continue lisinopril.         Respiratory   URI (upper respiratory infection)    Patient okay with continued use of over-the-counter treatment.  Supportive care.        Musculoskeletal and Integument   Herniated lumbar intervertebral disc - Primary    Advised against use of oxycodone.  Recommended follow-up with neurosurgery.        Other   Morbid obesity ([place])    Starting Zepbound.      Relevant Medications   tirzepatide (ZEPBOUND) 2.5 MG/0.5ML Pen    Meds ordered this encounter  Medications   tirzepatide (ZEPBOUND) 2.5 MG/0.5ML Pen    Sig: Inject 2.5 mg into the skin once a week.    Dispense:  2 mL    Refill:  0    Follow-up:  6 months  [place] DO [place]

## 2022-12-09 ENCOUNTER — Other Ambulatory Visit (HOSPITAL_COMMUNITY): Payer: Self-pay

## 2022-12-21 ENCOUNTER — Other Ambulatory Visit (HOSPITAL_COMMUNITY): Payer: Self-pay

## 2022-12-22 ENCOUNTER — Other Ambulatory Visit: Payer: Self-pay | Admitting: Family Medicine

## 2023-01-19 ENCOUNTER — Telehealth: Payer: Self-pay | Admitting: Family Medicine

## 2023-01-19 ENCOUNTER — Other Ambulatory Visit: Payer: Self-pay | Admitting: Family Medicine

## 2023-01-19 MED ORDER — LISINOPRIL 20 MG PO TABS
20.0000 mg | ORAL_TABLET | Freq: Every day | ORAL | 1 refills | Status: DC
  Filled 2023-01-19: qty 90, 90d supply, fill #0

## 2023-01-19 MED ORDER — ALBUTEROL SULFATE HFA 108 (90 BASE) MCG/ACT IN AERS
INHALATION_SPRAY | RESPIRATORY_TRACT | 0 refills | Status: DC
  Filled 2023-01-19: qty 6.7, 25d supply, fill #0

## 2023-01-19 NOTE — Telephone Encounter (Signed)
Patient wants to switch pharmacy from [facility] to [facility]  lisinopril (ZESTRIL) 20 MG tablet , albuterol (VENTOLIN HFA) 108 (90 Base) MCG/ACT inhaler , naproxen (NAPROSYN) 500 MG tablet

## 2023-01-20 ENCOUNTER — Other Ambulatory Visit (HOSPITAL_COMMUNITY): Payer: Self-pay

## 2023-01-22 ENCOUNTER — Other Ambulatory Visit (HOSPITAL_COMMUNITY): Payer: Self-pay

## 2023-01-22 ENCOUNTER — Other Ambulatory Visit: Payer: Self-pay

## 2023-01-27 ENCOUNTER — Other Ambulatory Visit: Payer: Self-pay | Admitting: Family Medicine

## 2023-01-30 ENCOUNTER — Other Ambulatory Visit: Payer: Self-pay | Admitting: Family Medicine

## 2023-02-13 ENCOUNTER — Other Ambulatory Visit: Payer: Self-pay | Admitting: Family Medicine

## 2023-02-17 ENCOUNTER — Other Ambulatory Visit: Payer: Self-pay | Admitting: Family Medicine

## 2023-03-23 ENCOUNTER — Other Ambulatory Visit: Payer: Self-pay | Admitting: Family Medicine

## 2023-04-17 ENCOUNTER — Other Ambulatory Visit: Payer: Self-pay | Admitting: Family Medicine

## 2023-05-20 ENCOUNTER — Other Ambulatory Visit (HOSPITAL_BASED_OUTPATIENT_CLINIC_OR_DEPARTMENT_OTHER): Payer: Self-pay

## 2023-05-20 ENCOUNTER — Emergency Department (HOSPITAL_BASED_OUTPATIENT_CLINIC_OR_DEPARTMENT_OTHER): Payer: BC Managed Care – PPO | Admitting: Radiology

## 2023-05-20 ENCOUNTER — Encounter (HOSPITAL_BASED_OUTPATIENT_CLINIC_OR_DEPARTMENT_OTHER): Payer: Self-pay | Admitting: Emergency Medicine

## 2023-05-20 ENCOUNTER — Other Ambulatory Visit: Payer: Self-pay

## 2023-05-20 ENCOUNTER — Emergency Department (HOSPITAL_BASED_OUTPATIENT_CLINIC_OR_DEPARTMENT_OTHER)
Admission: EM | Admit: 2023-05-20 | Discharge: 2023-05-20 | Disposition: A | Discharge status: Home / Self Care | Payer: BC Managed Care – PPO | Attending: Emergency Medicine | Admitting: Emergency Medicine

## 2023-05-20 ENCOUNTER — Emergency Department (HOSPITAL_BASED_OUTPATIENT_CLINIC_OR_DEPARTMENT_OTHER): Payer: BC Managed Care – PPO

## 2023-05-20 DIAGNOSIS — R002 Palpitations: Secondary | ICD-10-CM | POA: Insufficient documentation

## 2023-05-20 DIAGNOSIS — I10 Essential (primary) hypertension: Secondary | ICD-10-CM | POA: Diagnosis not present

## 2023-05-20 DIAGNOSIS — J45909 Unspecified asthma, uncomplicated: Secondary | ICD-10-CM | POA: Diagnosis not present

## 2023-05-20 DIAGNOSIS — R600 Localized edema: Secondary | ICD-10-CM | POA: Diagnosis present

## 2023-05-20 LAB — CBC WITH DIFFERENTIAL/PLATELET
Abs Immature Granulocytes: 0.03 10*3/uL (ref 0.00–0.07)
Basophils Absolute: 0 10*3/uL (ref 0.0–0.1)
Basophils Relative: 0 %
Eosinophils Absolute: 0.1 10*3/uL (ref 0.0–0.5)
Eosinophils Relative: 2 %
HCT: 40.8 % (ref 39.0–52.0)
Hemoglobin: 13.6 g/dL (ref 13.0–17.0)
Immature Granulocytes: 0 %
Lymphocytes Relative: 18 %
Lymphs Abs: 1.5 10*3/uL (ref 0.7–4.0)
MCH: 27.3 pg (ref 26.0–34.0)
MCHC: 33.3 g/dL (ref 30.0–36.0)
MCV: 81.9 fL (ref 80.0–100.0)
Monocytes Absolute: 0.6 10*3/uL (ref 0.1–1.0)
Monocytes Relative: 7 %
Neutro Abs: 6.3 10*3/uL (ref 1.7–7.7)
Neutrophils Relative %: 73 %
Platelets: 282 10*3/uL (ref 150–400)
RBC: 4.98 MIL/uL (ref 4.22–5.81)
RDW: 13.9 % (ref 11.5–15.5)
WBC: 8.6 10*3/uL (ref 4.0–10.5)
nRBC: 0 % (ref 0.0–0.2)

## 2023-05-20 LAB — COMPREHENSIVE METABOLIC PANEL
ALT: 29 U/L (ref 0–44)
AST: 21 U/L (ref 15–41)
Albumin: 4.3 g/dL (ref 3.5–5.0)
Alkaline Phosphatase: 63 U/L (ref 38–126)
Anion gap: 9 (ref 5–15)
BUN: 13 mg/dL (ref 6–20)
CO2: 28 mmol/L (ref 22–32)
Calcium: 9.1 mg/dL (ref 8.9–10.3)
Chloride: 101 mmol/L (ref 98–111)
Creatinine, Ser: 0.93 mg/dL (ref 0.61–1.24)
GFR, Estimated: >60 mL/min (ref >60)
Glucose, Bld: 96 mg/dL (ref 70–99)
Potassium: 3.9 mmol/L (ref 3.5–5.1)
Sodium: 138 mmol/L (ref 135–145)
Total Bilirubin: 0.4 mg/dL (ref 0.3–1.2)
Total Protein: 7.2 g/dL (ref 6.5–8.1)

## 2023-05-20 LAB — URINALYSIS, ROUTINE W REFLEX MICROSCOPIC
Bilirubin Urine: NEGATIVE
Glucose, UA: NEGATIVE mg/dL
Hgb urine dipstick: NEGATIVE
Ketones, ur: NEGATIVE mg/dL
Leukocytes,Ua: NEGATIVE
Nitrite: NEGATIVE
Protein, ur: NEGATIVE mg/dL
Specific Gravity, Urine: 1.009 (ref 1.005–1.030)
pH: 6.5 (ref 5.0–8.0)

## 2023-05-20 LAB — BRAIN NATRIURETIC PEPTIDE: B Natriuretic Peptide: 168.6 pg/mL — ABNORMAL HIGH (ref 0.0–100.0)

## 2023-05-20 LAB — TROPONIN I (HIGH SENSITIVITY)
Troponin I (High Sensitivity): 6 ng/L (ref <18)
Troponin I (High Sensitivity): 7 ng/L (ref <18)

## 2023-05-20 LAB — MAGNESIUM: Magnesium: 1.9 mg/dL (ref 1.7–2.4)

## 2023-05-20 LAB — TSH: TSH: 1.93 u[IU]/mL (ref 0.350–4.500)

## 2023-05-20 MED ORDER — KETOROLAC TROMETHAMINE 15 MG/ML IJ SOLN
15.0000 mg | Freq: Once | INTRAMUSCULAR | Status: AC
  Administered 2023-05-20: 15 mg via INTRAVENOUS
  Filled 2023-05-20: qty 1

## 2023-05-20 MED ORDER — FUROSEMIDE 40 MG PO TABS: 40.0000 mg | ORAL_TABLET | Freq: Every day | ORAL | 0 refills | Status: DC

## 2023-05-20 MED ORDER — FUROSEMIDE 40 MG PO TABS
40.0000 mg | ORAL_TABLET | Freq: Once | ORAL | Status: AC
  Administered 2023-05-20: 40 mg via ORAL
  Filled 2023-05-20: qty 1

## 2023-05-20 NOTE — Discharge Instructions (Addendum)
You were seen in the ER today for evaluation of your palpitations. Your work up was overall unremarkable, other than one of you labs was mildly elevated, likely from your leg swelling. Because of your symptoms, however, I am referring you to a cardiologist. They should reach out to you in the next few days, if not please call to schedule an appointment. I am going to send you home with another dose of Lasix to take after you get to [place]. Please make sure you are wearing compression socks and keeping your legs elevated. If you have any concerns, any new or worsening symptoms, please return to the nearest ER for re-evlaution.   Contact a doctor if: You keep having fast or uneven heartbeats for a long time. Your symptoms happen more often. Get help right away if: You have chest pain. You feel short of breath. You have a very bad headache. You feel dizzy. You faint. These symptoms may be an emergency. Get help right away. Call your local emergency services (911 in the [place] Do not wait to see if the symptoms will go away. Do not drive yourself to the hospital.

## 2023-05-20 NOTE — ED Triage Notes (Signed)
Pt arrives to ED with c/o heart fluttering that started today.

## 2023-05-20 NOTE — ED Provider Notes (Signed)
[facility] AT [facility] Provider Note   CSN: [id number] Arrival date & time: 05/20/23  1340     History Chief Complaint  Patient presents with   Irregular Heart Beat    Randy Navarro is a 42 y.o. male with history of asthma, degenerative disc disease, hypertension presents the emergency room today for evaluation of palpitations starting around 1130-1200 and lasting for 60-90 minutes shortly prior to arrival.  The patient reports that he was sitting at his desk when this happened.  He reports that it was not constant for the duration but would happen lasting a few seconds up to a minute.  He reports that send he has been at the emergency room and he has not felt recurrence of the symptoms.  He did not have any chest pain or shortness of breath with this.  Did not have any lightheadedness or any diaphoresis.  Denies any trouble walking or trouble talking during this.  Denies any headaches, cough, recent cold.  He reports that his legs are chronically swollen and have been worsening over the past few months.  He is recently seen urgent care for this and was given Lasix.  He reports that his left legs been more swollen than his right.  No known drug allergies.  HPI     Home Medications Prior to Admission medications   Medication Sig Start Date End Date Taking? Authorizing Provider  albuterol (VENTOLIN HFA) 108 (90 Base) MCG/ACT inhaler INHALE 2 PUFFS INTO THE LUNGS EVERY 6 HOURS AS NEEDED FOR WHEEZING OR SHORTNESS OF BREATH 02/15/23   Everlene Other G, DO  lisinopril (ZESTRIL) 20 MG tablet Take 1 tablet (20 mg total) by mouth daily. 01/19/23   Tommie Sams, DO  methocarbamol (ROBAXIN) 500 MG tablet TAKE 1 TABLET BY MOUTH TWICE A DAY 04/19/23   Cook, Jayce G, DO  naproxen (NAPROSYN) 500 MG tablet Take 1 tablet (500 mg total) by mouth 2 (two) times daily as needed for moderate pain. 12/30/21   Tommie Sams, DO  tirzepatide (ZEPBOUND) 2.5 MG/0.5ML Pen Inject 2.5 mg into the  skin once a week. 10/23/22   Tommie Sams, DO  Turmeric (QC TUMERIC COMPLEX PO) Take by mouth.    [provider]      Allergies    Shellfish allergy    Review of Systems   Review of Systems  Constitutional:  Negative for chills and fever.  HENT:  Negative for congestion and rhinorrhea.   Respiratory:  Negative for cough and shortness of breath.   Cardiovascular:  Positive for palpitations and leg swelling. Negative for chest pain.  Gastrointestinal:  Negative for abdominal pain, constipation, diarrhea, nausea and vomiting.  Neurological:  Negative for dizziness, syncope, light-headedness and headaches.    Physical Exam Updated Vital Signs BP (!) 144/87   Pulse 87   Temp 98.7 F (37.1 C) (Oral)   Resp 20   Ht 6\' 3"  (1.905 m)   Wt (!) 195 kg   SpO2 97%   BMI 53.75 kg/m  Physical Exam Vitals and nursing note reviewed.  Constitutional:      General: He is not in acute distress.    Appearance: He is not ill-appearing or toxic-appearing.  HENT:     Mouth/Throat:     Mouth: Mucous membranes are moist.  Eyes:     General: No scleral icterus. Cardiovascular:     Rate and Rhythm: Normal rate and regular rhythm.     Pulses:  Radial pulses are 2+ on the right side and 2+ on the left side.       Dorsalis pedis pulses are 2+ on the right side and 2+ on the left side.       Posterior tibial pulses are 2+ on the right side and 2+ on the left side.  Pulmonary:     Effort: Pulmonary effort is normal. No respiratory distress.     Breath sounds: Normal breath sounds.  Abdominal:     Tenderness: There is no abdominal tenderness. There is no guarding or rebound.  Musculoskeletal:     Right lower leg: Edema present.     Left lower leg: Edema present.     Comments: 1+ pitting edema bilaterally.  Left is slightly greater than right.  Compartments are firm but pliable.  Palpable distal pulses.  Skin:    General: Skin is warm and dry.  Neurological:     General: No  focal deficit present.     Mental Status: He is alert. Mental status is at baseline.     ED Results / Procedures / Treatments   Labs (all labs ordered are listed, but only abnormal results are displayed) Labs Reviewed  URINALYSIS, ROUTINE W REFLEX MICROSCOPIC - Abnormal; Notable for the following components:      Result Value   Color, Urine COLORLESS (*)    All other components within normal limits  BRAIN NATRIURETIC PEPTIDE - Abnormal; Notable for the following components:   B Natriuretic Peptide 168.6 (*)    All other components within normal limits  CBC WITH DIFFERENTIAL/PLATELET  COMPREHENSIVE METABOLIC PANEL  TSH  MAGNESIUM  TROPONIN I (HIGH SENSITIVITY)  TROPONIN I (HIGH SENSITIVITY)    EKG EKG Interpretation Date/Time:  Thursday May 20 2023 13:49:18 EDT Ventricular Rate:  96 PR Interval:  140 QRS Duration:  97 QT Interval:  452 QTC Calculation: 572 R Axis:   3  Text Interpretation: Sinus rhythm Low voltage, precordial leads RSR' in V1 or V2, right VCD or RVH Prolonged QT interval No previous tracing Confirmed by Gwyneth Sprout ([id number]) on 05/20/2023 1:55:18 PM  Radiology US Venous Img Lower Unilateral Left  Result Date: 05/20/2023 CLINICAL DATA:  Lower leg swelling EXAM: Left LOWER EXTREMITY VENOUS DOPPLER ULTRASOUND TECHNIQUE: Gray-scale sonography with compression, as well as color and duplex ultrasound, were performed to evaluate the deep venous system(s) from the level of the common femoral vein through the popliteal and proximal calf veins. COMPARISON:  None Available. FINDINGS: VENOUS Normal compressibility of the common femoral, superficial femoral, and popliteal veins, as well as the visualized calf veins. Visualized portions of profunda femoral vein and great saphenous vein unremarkable. No filling defects to suggest DVT on grayscale or color Doppler imaging. Doppler waveforms show normal direction of venous flow, normal respiratory plasticity and response to  augmentation. Limited views of the contralateral common femoral vein are unremarkable. OTHER Soft tissue edema identified. Limitations: Overlapping soft tissue. IMPRESSION: No evidence of left lower extremity DVT. Soft tissue edema. Some limitations with overlapping soft tissue and edema. Electronically Signed   By: Karen Kays M.D.   On: 05/20/2023 19:57   DG Chest 2 View  Result Date: 05/20/2023 CLINICAL DATA:  Palpitations EXAM: CHEST - 2 VIEW COMPARISON:  None Available. FINDINGS: The heart size and mediastinal contours are within normal limits. Both lungs are clear. The visualized skeletal structures are unremarkable. IMPRESSION: No active cardiopulmonary disease. Electronically Signed   By: Darliss Cheney M.D.   On: 05/20/2023 15:49  Procedures Procedures   Medications Ordered in ED Medications - No data to display  ED Course/ Medical Decision Making/ A&P                               Medical Decision Making Amount and/or Complexity of Data Reviewed Labs: ordered. Radiology: ordered.  Risk Prescription drug management.  42 y.o. male presents to the ER for evaluation of palpitations. Differential diagnosis includes but is not limited to Cardiac arrhythmias, ACS, CHF, pericarditis, valvular disease, panic/anxiety, ETOH, stimulant use, medication side effect, anemia, hyperthyroidism, pulmonary embolism. Vital signs mildly elevated blood pressure otherwise unremarkable. Physical exam as noted above.   EKG reviewed and interpreted my attending and read as Sinus rhythm Low voltage, precordial leads RSR' in V1 or V2, right VCD or RVH Prolonged QT interval No previous tracing   I independently reviewed and interpreted the patient's labs.  CBC without leukocytosis or anemia.  CMP without electrolyte or LFT abnormality.  Urinalysis shows colorless urine.  Magnesium within normal limits.  Troponins are flat.  BNP slightly elevated at 168.6.  TSH within normal limits as well.  Chest x-ray  shows No active cardiopulmonary disease.   Ultrasound of the left lower extremity shows  No evidence of left lower extremity DVT. Soft tissue edema. Some limitations with overlapping soft tissue and edema.   I reviewed the patient's cardiac monitoring.  He occasionally had a PVC however I did not see any signs of A-fib.  I have a lower suspicion for any PE given his DVT study was negative.  He has not had any recurring symptoms.  Troponins are flat with a reassuring EKG, less likely ACS.  TSH within normal limits.  Question some CHF given his elevated BNP and lower leg swelling however he is not hypoxic nor is complaining of any chest pain or shortness of breath and his EKG does not show any signs of pleural effusions. I will give him a dose of Lasix tonight as well as send him home with a dose.  I am going to give him amatory follow-up with cardiology as well given his worsening lower leg edema and his other comorbidities.  His lab work is generally unremarkable other than slightly elevated BNP.  His chest x-ray is unremarkable does not show any signs of pleural effusions.  His lung sounds are clear to auscultation.  He does not have any shortness of breath and his vital signs are within normal limits.  He is not hypoxic.  He has not had any of these recurring symptoms since being here.  His DVT ultrasound is unremarkable.  The patient feels good and anticipates discharge home.  Given this evaluation, I do feel the patient stable for discharge home with outpatient cardio follow-up.  We discussed the results of the labs/imaging. The plan is take medication as prescribed, follow-up with cardiology. We discussed strict return precautions and red flag symptoms. The patient verbalized their understanding and agrees to the plan. The patient is stable and being discharged home in good condition.  Portions of this report may have been transcribed using voice recognition software. Every effort was made to ensure  accuracy; however, inadvertent computerized transcription errors may be present.   Final Clinical Impression(s) / ED Diagnoses Final diagnoses:  Lower leg edema  Palpitations    Rx / DC Orders ED Discharge Orders          Ordered    furosemide (LASIX)  40 MG tablet  Daily,   Status:  Discontinued        05/20/23 1857    Ambulatory referral to Cardiology        05/20/23 2014    furosemide (LASIX) 40 MG tablet  Daily        05/20/23 2016              Achille Rich, [place] 05/22/23 2321    Gwyneth Sprout, MD 05/25/23 1453

## 2023-05-21 ENCOUNTER — Telehealth: Payer: Self-pay

## 2023-05-21 NOTE — Transitions of Care (Post Inpatient/ED Visit) (Signed)
   05/21/2023  Name: Randy Navarro MRN: [id number] DOB: November 26, 1980  Today's TOC FU Call Status: Today's TOC FU Call Status:: Successful TOC FU Call Completed TOC FU Call Complete Date: 05/21/23  Transition Care Management Follow-up Telephone Call Date of Discharge: 05/20/23 Discharge Facility: [facility] Type of Discharge: Emergency Department How have you been since you were released from the hospital?: Better Any questions or concerns?: No  Items Reviewed: Did you receive and understand the discharge instructions provided?: Yes Medications obtained,verified, and reconciled?: Yes (Medications Reviewed) Any new allergies since your discharge?: No Dietary orders reviewed?: NA Do you have support at home?: Yes People in Home: spouse  Medications Reviewed Today: Medications Reviewed Today   Medications were not reviewed in this encounter     Home Care and Equipment/Supplies: Were Home Health Services Ordered?: No Any new equipment or medical supplies ordered?: No  Functional Questionnaire: Do you need assistance with bathing/showering or dressing?: No Do you need assistance with meal preparation?: No Do you need assistance with eating?: No Do you have difficulty maintaining continence: No Do you need assistance with getting out of bed/getting out of a chair/moving?: No Do you have difficulty managing or taking your medications?: No  Follow up appointments reviewed: PCP Follow-up appointment confirmed?: No (pt will schedule when he returns from [facility] Follow-up appointment confirmed?: No Do you need transportation to your follow-up appointment?: No Do you understand care options if your condition(s) worsen?: Yes-patient verbalized understanding    SIGNATURE tb,cma

## 2023-07-15 ENCOUNTER — Other Ambulatory Visit: Payer: Self-pay | Admitting: Family Medicine

## 2023-07-22 ENCOUNTER — Ambulatory Visit: Payer: BC Managed Care – PPO | Attending: Cardiology | Admitting: Cardiology

## 2023-07-22 ENCOUNTER — Ambulatory Visit (INDEPENDENT_AMBULATORY_CARE_PROVIDER_SITE_OTHER): Payer: BC Managed Care – PPO

## 2023-07-22 ENCOUNTER — Encounter: Payer: Self-pay | Admitting: Cardiology

## 2023-07-22 VITALS — BP 142/96 | HR 88 | Ht 75.0 in | Wt >= 6400 oz

## 2023-07-22 DIAGNOSIS — R002 Palpitations: Secondary | ICD-10-CM | POA: Diagnosis not present

## 2023-07-22 DIAGNOSIS — R600 Localized edema: Secondary | ICD-10-CM | POA: Diagnosis not present

## 2023-07-22 DIAGNOSIS — I10 Essential (primary) hypertension: Secondary | ICD-10-CM | POA: Diagnosis not present

## 2023-07-22 MED ORDER — AMLODIPINE BESYLATE 5 MG PO TABS: 5.0000 mg | ORAL_TABLET | Freq: Every day | ORAL | 3 refills | Status: DC

## 2023-07-22 NOTE — Progress Notes (Unsigned)
Enrolled patient for a 14 day Zio XT  monitor to be mailed to patients home  °

## 2023-07-22 NOTE — Progress Notes (Signed)
Cardiology Office Note:    Date:  07/25/2023   ID:  Randy Navarro, DOB 1981-06-10, MRN [id number]  PCP:  Roe Rutherford, NP  Cardiologist:  None  Electrophysiologist:  None   Referring MD: Achille Rich, PA-C   Chief Complaint  Patient presents with   Palpitations    History of Present Illness:    Randy Navarro is a 42 y.o. male with a hx of hypertension, ADHD, asthma, OSA who presentes as an ED follow-up for palpitations.  He was seen in ED on 05/20/23 for palpitations, work-up unremarkable.  Troponins negative x 2, BNP 168.  He reports that prior to ED visit he felt fluttering feeling in chest that lasted about an hour.  Previously had had episodes of this but only last for few seconds.  The day following his ED visit he had another episode that lasted for about 15 minutes.  Currently is having episodes intermittently, over the last week just had 1 episode but only lasted for 5 seconds.  Does report he has lower extremity edema, has been taking Lasix 40 mg daily.  He denies any chest pain, dyspnea, lightheadedness, syncope, or palpitations.  He does not smoke but chews tobacco, has for 24 years.  Family history includes paternal grandfather had multiple MIs, thinks first was in the 20s.  Past Medical History:  Diagnosis Date   ADD (attention deficit disorder)    ADHD (attention deficit hyperactivity disorder)    Asthma    Back pain    Blood pressure elevated without history of HTN    Heartburn    Hypertension    IBS (irritable bowel syndrome)    Joint pain    Pneumonia age 85 year   SOBOE (shortness of breath on exertion)    Swelling of both lower extremities     No past surgical history on file.  Current Medications: Current Meds  Medication Sig   albuterol (VENTOLIN HFA) 108 (90 Base) MCG/ACT inhaler INHALE 2 PUFFS INTO THE LUNGS EVERY 6 HOURS AS NEEDED FOR WHEEZING OR SHORTNESS OF BREATH   amLODipine (NORVASC) 5 MG tablet Take 1 tablet (5 mg total) by mouth daily.    furosemide (LASIX) 40 MG tablet Take 40 mg by mouth daily.   lisinopril (ZESTRIL) 20 MG tablet Take 1 tablet (20 mg total) by mouth daily.   Magnesium 200 MG TABS Take by mouth.   methocarbamol (ROBAXIN) 500 MG tablet TAKE 1 TABLET BY MOUTH TWICE A DAY   naproxen (NAPROSYN) 500 MG tablet Take 1 tablet (500 mg total) by mouth 2 (two) times daily as needed for moderate pain.   Turmeric (QC TUMERIC COMPLEX PO) Take by mouth.     Allergies:   Shellfish allergy   Social History   Socioeconomic History   Marital status: Married    Spouse name: Engineer, agricultural   Number of children: 1   Years of education: Not on file   Highest education level: Not on file  Occupational History   Occupation: Engineer, materials   Occupation: Engineer, water  Tobacco Use   Smoking status: Former    Current packs/day: 0.10    Types: Cigarettes   Smokeless tobacco: Current    Types: Chew   Tobacco comments:     about 20 yrs.  Vaping Use   Vaping status: Never Used  Substance and Sexual Activity   Alcohol use: Yes    Alcohol/week: 0.0 standard drinks of alcohol    Comment: 4 beers per month   Drug  use: Never   Sexual activity: Not on file  Other Topics Concern   Not on file  Social History Narrative   Not on file   Social Determinants of Health   Financial Resource Strain: Low Risk  (05/07/2023)   Received from [facility]   Overall Financial Resource Strain (CARDIA)    Difficulty of Paying Living Expenses: Not very hard  Food Insecurity: No Food Insecurity (05/07/2023)   Received from [facility]   Hunger Vital Sign    Worried About Running Out of Food in the Last Year: Never true    Ran Out of Food in the Last Year: Never true  Transportation Needs: No Transportation Needs (05/07/2023)   Received from [facility] - Transportation    Lack of Transportation (Medical): No    Lack of Transportation (Non-Medical): No  Physical Activity: Inactive (05/07/2023)   Received from  [facility]   Exercise Vital Sign    Days of Exercise per Week: 1 day    Minutes of Exercise per Session: 0 min  Stress: No Stress Concern Present (05/07/2023)   Received from [facility] - Occupational Stress Questionnaire    Feeling of Stress : Only a little  Social Connections: Moderately Integrated (05/07/2023)   Received from [facility]   Social Network    How would you rate your social network (family, work, friends)?: Adequate participation with social networks     Family History: The patient's family history includes Hypertension in his father and mother; Kidney disease in his mother.  ROS:   Please see the history of present illness.     All other systems reviewed and are negative.  EKGs/Labs/Other Studies Reviewed:    The following studies were reviewed today:   EKG:  07/22/2023: Normal sinus rhythm, rate 88, nonspecific T wave flattening, motion artifact  Recent Labs: 05/20/2023: Hemoglobin 13.6; Magnesium 1.9; Platelets 282; TSH 1.930 07/22/2023: ALT 24; BNP 65.0; BUN 19; Creatinine, Ser 0.98; Potassium 4.0; Sodium 140  Recent Lipid Panel    Component Value Date/Time   CHOL 185 12/30/2021 0910   TRIG 218 (H) 12/30/2021 0910   HDL 34 (L) 12/30/2021 0910   CHOLHDL 5.4 (H) 12/30/2021 0910   LDLCALC 113 (H) 12/30/2021 0910    Physical Exam:    VS:  BP (!) 142/96   Pulse 88   Ht 6\' 3"  (1.905 m)   Wt (!) 427 lb (193.7 kg)   SpO2 96%   BMI 53.37 kg/m     Wt Readings from Last 3 Encounters:  07/22/23 (!) 427 lb (193.7 kg)  05/20/23 (!) 430 lb (195 kg)  10/23/22 (!) 411 lb 12.8 oz (186.8 kg)     GEN:  Well nourished, well developed in no acute distress HEENT: Normal NECK: No JVD; No carotid bruits LYMPHATICS: No lymphadenopathy CARDIAC: RRR, no murmurs, rubs, gallops RESPIRATORY:  Clear to auscultation without rales, wheezing or rhonchi  ABDOMEN: Soft, non-tender, non-distended MUSCULOSKELETAL:  1+ LE  edema; No deformity  SKIN: Warm and dry NEUROLOGIC:  Alert and oriented x 3 PSYCHIATRIC:  Normal affect   ASSESSMENT:    1. Palpitations   2. Lower extremity edema   3. Essential hypertension   4. Morbid obesity (HCC)    PLAN:    Palpitations: Description concerning for arrhythmia, evaluate with Zio patch x 14 days.  Check echocardiogram to evaluate for structural heart disease  Lower extremity edema: 1+ bilateral lower extremity edema  on exam today.  He is on Lasix 40 mg daily, would continue.  Recommend compression stockings while awake.  Check echocardiogram as above  Hypertension: on lisinopril 20 mg daily, Lasix 40 mg daily.  BP elevated, will add amlodipine 5 mg daily.  Asked to check BP daily for next 2 weeks and bring log and home BP monitor to appointment with pharmacy hypertension clinic in 2 weeks  Morbid obesity: Body mass index is 53.37 kg/m.  Recommend follow-up in pharmacy clinic to evaluate for starting Wegovy  OSA: reports compliance with CPAP   RTC in 3 months   Medication Adjustments/Labs and Tests Ordered: Current medicines are reviewed at length with the patient today.  Concerns regarding medicines are outlined above.  Orders Placed This Encounter  Procedures   Comprehensive metabolic panel   Brain natriuretic peptide   AMB Referral to [facility] Pharm-D   LONG TERM MONITOR (3-14 DAYS)   EKG 12-Lead   ECHOCARDIOGRAM COMPLETE   Meds ordered this encounter  Medications   amLODipine (NORVASC) 5 MG tablet    Sig: Take 1 tablet (5 mg total) by mouth daily.    Dispense:  90 tablet    Refill:  3    Patient Instructions  Medication Instructions:  Start Amlodipine 5 mg daily *If you need a refill on your cardiac medications before your next appointment, please call your pharmacy*   Lab Work: CMP, BNP If you have labs (blood work) drawn today and your tests are completely normal, you will receive your results only by: MyChart Message (if you have  MyChart) OR A paper copy in the mail If you have any lab test that is abnormal or we need to change your treatment, we will call you to review the results.  Keep a BP log: Check daily for 2 weeks. Bring log and BP monitor to your Pharmacy Appointment  Testing/Procedures: Your physician has requested that you have an echocardiogram. Echocardiography is a painless test that uses sound waves to create images of your heart. It provides your doctor with information about the size and shape of your heart and how well your heart's chambers and valves are working. This procedure takes approximately one hour. There are no restrictions for this procedure. Please do NOT wear cologne, perfume, aftershave, or lotions (deodorant is allowed). Please arrive 15 minutes prior to your appointment time.   Your physician has recommended that you wear a 14 DAY ZIO-PATCH monitor. The Zio patch cardiac monitor continuously records heart rhythm data for up to 14 days, this is for patients being evaluated for multiple types heart rhythms. For the first 24 hours post application, please avoid getting the Zio monitor wet in the shower or by excessive sweating during exercise. After that, feel free to carry on with regular activities. Keep soaps and lotions away from the ZIO XT Patch.  This will be mailed to you, please expect 7-10 days to receive.    Applying the monitor   Shave hair from upper left chest.   Hold abrader disc by orange tab.  Rub abrader in 40 strokes over left upper chest as indicated in your monitor instructions.   Clean area with 4 enclosed alcohol pads .  Use all pads to assure are is cleaned thoroughly.  Let dry.   Apply patch as indicated in monitor instructions.  Patch will be place under collarbone on left side of chest with arrow pointing upward.   Rub patch adhesive wings for 2 minutes.Remove white label marked "1".  Remove white label marked "2".  Rub patch adhesive wings for 2 additional  minutes.   While looking in a mirror, press and release button in center of patch.  A small green light will flash 3-4 times .  This will be your only indicator the monitor has been turned on.     Do not shower for the first 24 hours.  You may shower after the first 24 hours.   Press button if you feel a symptom. You will hear a small click.  Record Date, Time and Symptom in the Patient Log Book.   When you are ready to remove patch, follow instructions on last 2 pages of Patient Log Book.  Stick patch monitor onto last page of Patient Log Book.   Place Patient Log Book in [place] box.  Use locking tab on box and tape box closed securely.  The Orange and [employer] has [employer] on it.  Please place in mailbox as soon as possible.  Your physician should have your test results approximately 7 days after the monitor has been mailed back to [facility].   Call [facility] Customer Care at [phone] if you have questions regarding your ZIO XT patch monitor.  Call them immediately if you see an orange light blinking on your monitor.   If your monitor falls off in less than 4 days contact our Monitor department at [phone].  If your monitor becomes loose or falls off after 4 days call [employer] at [phone] for suggestions on securing your monitor    Follow-Up: At [facility], you and your health needs are our priority.  As part of our continuing mission to provide you with exceptional heart care, we have created designated Provider Care Teams.  These Care Teams include your primary Cardiologist (physician) and Advanced Practice Providers (APPs -  Physician Assistants and Nurse Practitioners) who all work together to provide you with the care you need, when you need it.  We recommend signing up for the patient portal called "MyChart".  Sign up information is provided on this After Visit Summary.  MyChart is used to connect with patients for Virtual Visits (Telemedicine).   Patients are able to view lab/test results, encounter notes, upcoming appointments, etc.  Non-urgent messages can be sent to your provider as well.   To learn more about what you can do with MyChart, go to [email or web address].    Your next appointment:    PharmD- 2 weeks- for HTN and weight management     Dr Bjorn Pippin- 3 mos    Signed, Little Ishikawa, MD  07/25/2023 9:55 PM    [facility]

## 2023-07-22 NOTE — Patient Instructions (Signed)
Medication Instructions:  Start Amlodipine 5 mg daily *If you need a refill on your cardiac medications before your next appointment, please call your pharmacy*   Lab Work: CMP, BNP If you have labs (blood work) drawn today and your tests are completely normal, you will receive your results only by: MyChart Message (if you have MyChart) OR A paper copy in the mail If you have any lab test that is abnormal or we need to change your treatment, we will call you to review the results.  Keep a BP log: Check daily for 2 weeks. Bring log and BP monitor to your Pharmacy Appointment  Testing/Procedures: Your physician has requested that you have an echocardiogram. Echocardiography is a painless test that uses sound waves to create images of your heart. It provides your doctor with information about the size and shape of your heart and how well your heart's chambers and valves are working. This procedure takes approximately one hour. There are no restrictions for this procedure. Please do NOT wear cologne, perfume, aftershave, or lotions (deodorant is allowed). Please arrive 15 minutes prior to your appointment time.   Your physician has recommended that you wear a 14 DAY ZIO-PATCH monitor. The Zio patch cardiac monitor continuously records heart rhythm data for up to 14 days, this is for patients being evaluated for multiple types heart rhythms. For the first 24 hours post application, please avoid getting the Zio monitor wet in the shower or by excessive sweating during exercise. After that, feel free to carry on with regular activities. Keep soaps and lotions away from the ZIO XT Patch.  This will be mailed to you, please expect 7-10 days to receive.    Applying the monitor   Shave hair from upper left chest.   Hold abrader disc by orange tab.  Rub abrader in 40 strokes over left upper chest as indicated in your monitor instructions.   Clean area with 4 enclosed alcohol pads .  Use all pads to  assure are is cleaned thoroughly.  Let dry.   Apply patch as indicated in monitor instructions.  Patch will be place under collarbone on left side of chest with arrow pointing upward.   Rub patch adhesive wings for 2 minutes.Remove white label marked "1".  Remove white label marked "2".  Rub patch adhesive wings for 2 additional minutes.   While looking in a mirror, press and release button in center of patch.  A small green light will flash 3-4 times .  This will be your only indicator the monitor has been turned on.     Do not shower for the first 24 hours.  You may shower after the first 24 hours.   Press button if you feel a symptom. You will hear a small click.  Record Date, Time and Symptom in the Patient Log Book.   When you are ready to remove patch, follow instructions on last 2 pages of Patient Log Book.  Stick patch monitor onto last page of Patient Log Book.   Place Patient Log Book in [place] box.  Use locking tab on box and tape box closed securely.  The [employer] and [employer] has [employer] on it.  Please place in mailbox as soon as possible.  Your physician should have your test results approximately 7 days after the monitor has been mailed back to [facility].   Call [facility] Customer Care at [phone] if you have questions regarding your ZIO XT patch monitor.  Call them  immediately if you see an orange light blinking on your monitor.   If your monitor falls off in less than 4 days contact our Monitor department at [phone].  If your monitor becomes loose or falls off after 4 days call [employer] at [phone] for suggestions on securing your monitor    Follow-Up: At [facility], you and your health needs are our priority.  As part of our continuing mission to provide you with exceptional heart care, we have created designated Provider Care Teams.  These Care Teams include your primary Cardiologist (physician) and Advanced Practice Providers (APPs -   Physician Assistants and Nurse Practitioners) who all work together to provide you with the care you need, when you need it.  We recommend signing up for the patient portal called "MyChart".  Sign up information is provided on this After Visit Summary.  MyChart is used to connect with patients for Virtual Visits (Telemedicine).  Patients are able to view lab/test results, encounter notes, upcoming appointments, etc.  Non-urgent messages can be sent to your provider as well.   To learn more about what you can do with MyChart, go to [email or web address].    Your next appointment:    PharmD- 2 weeks- for HTN and weight management     Dr Bjorn Pippin- 3 mos

## 2023-07-23 LAB — COMPREHENSIVE METABOLIC PANEL
ALT: 24 [IU]/L (ref 0–44)
AST: 18 [IU]/L (ref 0–40)
Albumin: 4.4 g/dL (ref 4.1–5.1)
Alkaline Phosphatase: 95 [IU]/L (ref 44–121)
BUN/Creatinine Ratio: 19 (ref 9–20)
BUN: 19 mg/dL (ref 6–24)
Bilirubin Total: 0.3 mg/dL (ref 0.0–1.2)
CO2: 23 mmol/L (ref 20–29)
Calcium: 9.6 mg/dL (ref 8.7–10.2)
Chloride: 99 mmol/L (ref 96–106)
Creatinine, Ser: 0.98 mg/dL (ref 0.76–1.27)
Globulin, Total: 2.6 g/dL (ref 1.5–4.5)
Glucose: 95 mg/dL (ref 70–99)
Potassium: 4 mmol/L (ref 3.5–5.2)
Sodium: 140 mmol/L (ref 134–144)
Total Protein: 7 g/dL (ref 6.0–8.5)
eGFR: 100 mL/min/{1.73_m2} (ref >59)

## 2023-07-23 LAB — BRAIN NATRIURETIC PEPTIDE: BNP: 65 pg/mL (ref 0.0–100.0)

## 2023-07-28 DIAGNOSIS — R002 Palpitations: Secondary | ICD-10-CM

## 2023-07-29 ENCOUNTER — Other Ambulatory Visit: Payer: Self-pay

## 2023-07-29 ENCOUNTER — Emergency Department (HOSPITAL_BASED_OUTPATIENT_CLINIC_OR_DEPARTMENT_OTHER)
Admission: EM | Admit: 2023-07-29 | Discharge: 2023-07-30 | Disposition: A | Discharge status: Home / Self Care | Payer: BC Managed Care – PPO | Attending: Emergency Medicine | Admitting: Emergency Medicine

## 2023-07-29 DIAGNOSIS — Z79899 Other long term (current) drug therapy: Secondary | ICD-10-CM | POA: Insufficient documentation

## 2023-07-29 DIAGNOSIS — I10 Essential (primary) hypertension: Secondary | ICD-10-CM | POA: Insufficient documentation

## 2023-07-29 DIAGNOSIS — J4530 Mild persistent asthma, uncomplicated: Secondary | ICD-10-CM | POA: Diagnosis not present

## 2023-07-29 DIAGNOSIS — J45909 Unspecified asthma, uncomplicated: Secondary | ICD-10-CM | POA: Insufficient documentation

## 2023-07-29 DIAGNOSIS — R030 Elevated blood-pressure reading, without diagnosis of hypertension: Secondary | ICD-10-CM

## 2023-07-29 NOTE — ED Triage Notes (Signed)
Pt POV from home reporting BP 200 sys. Takes lisinopril and amlodipine. Denies any other symptoms.

## 2023-07-30 MED ORDER — LISINOPRIL 20 MG PO TABS: 20.0000 mg | ORAL_TABLET | Freq: Every day | ORAL | 0 refills | Status: DC

## 2023-07-30 MED ORDER — LISINOPRIL 10 MG PO TABS
20.0000 mg | ORAL_TABLET | Freq: Once | ORAL | Status: AC
  Administered 2023-07-30: 20 mg via ORAL
  Filled 2023-07-30: qty 2

## 2023-07-30 NOTE — ED Notes (Signed)
 RN reviewed discharge instructions with pt. Pt verbalized understanding and had no further questions. VSS upon discharge.  

## 2023-07-30 NOTE — ED Notes (Signed)
ED Provider at bedside. 

## 2023-07-30 NOTE — ED Provider Notes (Signed)
[place] EMERGENCY DEPARTMENT AT [facility] Provider Note  CSN: [id number] Arrival date & time: 07/29/23 2040  Chief Complaint(s) Hypertension  HPI Randy Navarro is a 42 y.o. male with a past medical history listed below including hypertension who presents to the emergency department with concerns for elevated blood pressure readings at home.  He reports a blood pressure reading in the 200s with subsequent blood pressure readings in the 160s and 170s.  He denies any associated chest pain or shortness of breath.  No abdominal pain.  No dizziness.  No headache.  No focal deficits.   Patient states that since changing his his PCP, he has not had his lisinopril for several weeks.  He is taking amlodipine which was prescribed by cardiologist recently as well as Lasix prescribed in the emergency department for peripheral edema.  With treatment, he reports that the edema has significantly subsided.  On review of cardiology notes and labs, patient's BNP had down trended from 160s (obtained in the emergency department during prior encounter ) to 60s.  Renal function was intact 1 week ago.  The history is provided by the patient.    Past Medical History Past Medical History:  Diagnosis Date   ADD (attention deficit disorder)    ADHD (attention deficit hyperactivity disorder)    Asthma    Back pain    Blood pressure elevated without history of HTN    Heartburn    Hypertension    IBS (irritable bowel syndrome)    Joint pain    Pneumonia age 30 year   SOBOE (shortness of breath on exertion)    Swelling of both lower extremities    Patient Active Problem List   Diagnosis Date Noted   URI (upper respiratory infection) 10/23/2022   Mixed hyperlipidemia 04/15/2022   Prediabetes 03/19/2021   Gastroesophageal reflux disease 02/11/2021   Morbid obesity (HCC) 01/07/2021   Essential hypertension 01/07/2021   Herniated lumbar intervertebral disc 01/07/2021   Mild persistent asthma  10/08/2014   Home Medication(s) Prior to Admission medications   Medication Sig Start Date End Date Taking? Authorizing Provider  lisinopril (ZESTRIL) 20 MG tablet Take 1 tablet (20 mg total) by mouth daily. 07/30/23 08/29/23 Yes Delainy Mcelhiney, Amadeo Garnet, MD  albuterol (VENTOLIN HFA) 108 (90 Base) MCG/ACT inhaler INHALE 2 PUFFS INTO THE LUNGS EVERY 6 HOURS AS NEEDED FOR WHEEZING OR SHORTNESS OF BREATH 02/15/23   Everlene Other G, DO  amLODipine (NORVASC) 5 MG tablet Take 1 tablet (5 mg total) by mouth daily. 07/22/23   Little Ishikawa, MD  furosemide (LASIX) 40 MG tablet Take 40 mg by mouth daily.    [provider]  lisinopril (ZESTRIL) 20 MG tablet Take 1 tablet (20 mg total) by mouth daily. 01/19/23   Tommie Sams, DO  Magnesium 200 MG TABS Take by mouth.    [provider]  methocarbamol (ROBAXIN) 500 MG tablet TAKE 1 TABLET BY MOUTH TWICE A DAY 04/19/23   Cook, Jayce G, DO  naproxen (NAPROSYN) 500 MG tablet Take 1 tablet (500 mg total) by mouth 2 (two) times daily as needed for moderate pain. 12/30/21   Tommie Sams, DO  tirzepatide (ZEPBOUND) 2.5 MG/0.5ML Pen Inject 2.5 mg into the skin once a week. Patient not taking: Reported on 07/22/2023 10/23/22   Tommie Sams, DO  Turmeric (QC TUMERIC COMPLEX PO) Take by mouth.    [provider]  Allergies Shellfish allergy  Review of Systems Review of Systems As noted in HPI  Physical Exam Vital Signs  I have reviewed the triage vital signs BP (!) 148/91   Pulse 85   Temp 98.5 F (36.9 C) (Oral)   Resp 17   Ht 6\' 3"  (1.905 m)   Wt (!) 194.1 kg   SpO2 98%   BMI 53.50 kg/m   Physical Exam Vitals reviewed.  Constitutional:      General: He is not in acute distress.    Appearance: He is well-developed. He is morbidly obese. He is not diaphoretic.  HENT:     Head: Normocephalic  and atraumatic.     Nose: Nose normal.  Eyes:     General: No scleral icterus.       Right eye: No discharge.        Left eye: No discharge.     Conjunctiva/sclera: Conjunctivae normal.     Pupils: Pupils are equal, round, and reactive to light.  Cardiovascular:     Rate and Rhythm: Normal rate and regular rhythm.     Heart sounds: No murmur heard.    No friction rub. No gallop.  Pulmonary:     Effort: Pulmonary effort is normal. No respiratory distress.     Breath sounds: Normal breath sounds. No stridor. No rales.  Abdominal:     General: There is no distension.     Palpations: Abdomen is soft.     Tenderness: There is no abdominal tenderness.  Musculoskeletal:        General: No tenderness.     Cervical back: Normal range of motion and neck supple.  Skin:    General: Skin is warm and dry.     Findings: No erythema or rash.  Neurological:     Mental Status: He is alert and oriented to person, place, and time.     ED Results and Treatments Labs (all labs ordered are listed, but only abnormal results are displayed) Labs Reviewed - No data to display                                                                                                                       EKG  EKG Interpretation Date/Time:    Ventricular Rate:    PR Interval:    QRS Duration:    QT Interval:    QTC Calculation:   R Axis:      Text Interpretation:         Radiology No results found.  Medications Ordered in ED Medications  lisinopril (ZESTRIL) tablet 20 mg (20 mg Oral Given 07/30/23 0143)   Procedures Procedures  (including critical care time) Medical Decision Making / ED Course   Medical Decision Making Risk Prescription drug management.    Asymptomatic hypertension.  Isolated blood pressure readings in the 200s at home.  Blood pressure readings here have been relatively stable in the 140s to 160s without intervention.  No signs or symptoms concerning for  endorgan damage.   Given recent blood work, no labs necessary at this time.  He was given a dose of his home lisinopril and given prescription for 30 days in order for him to coordinate refill from his new PCP.    Final Clinical Impression(s) / ED Diagnoses Final diagnoses:  Elevated blood pressure reading   The patient appears reasonably screened and/or stabilized for discharge and I doubt any other medical condition or other [facility] requiring further screening, evaluation, or treatment in the ED at this time. I have discussed the findings, Dx and Tx plan with the patient/family who expressed understanding and agree(s) with the plan. Discharge instructions discussed at length. The patient/family was given strict return precautions who verbalized understanding of the instructions. No further questions at time of discharge.  Disposition: Discharge  Condition: Good  ED Discharge Orders          Ordered    lisinopril (ZESTRIL) 20 MG tablet  Daily        07/30/23 0131             Follow Up: Primary care provider  Call  to schedule an appointment for close follow up    This chart was dictated using voice recognition software.  Despite best efforts to proofread,  errors can occur which can change the documentation meaning.    Nira Conn, MD 07/30/23 [phone]

## 2023-08-02 NOTE — Plan of Care (Signed)
[facility] Tonsillectomy/Adenoidectomy, Postoperative PEDS care plan entered in error.

## 2023-08-19 ENCOUNTER — Ambulatory Visit (HOSPITAL_COMMUNITY): Payer: BC Managed Care – PPO | Attending: Cardiology

## 2023-08-19 ENCOUNTER — Telehealth: Payer: Self-pay | Admitting: Pharmacy Technician

## 2023-08-19 ENCOUNTER — Ambulatory Visit: Payer: BC Managed Care – PPO

## 2023-08-19 ENCOUNTER — Other Ambulatory Visit (HOSPITAL_COMMUNITY): Payer: Self-pay

## 2023-08-19 ENCOUNTER — Encounter: Payer: Self-pay | Admitting: Pharmacist

## 2023-08-19 ENCOUNTER — Telehealth: Payer: Self-pay | Admitting: Pharmacist

## 2023-08-19 VITALS — BP 145/95 | HR 81 | Ht 75.0 in | Wt >= 6400 oz

## 2023-08-19 DIAGNOSIS — I10 Essential (primary) hypertension: Secondary | ICD-10-CM

## 2023-08-19 DIAGNOSIS — R002 Palpitations: Secondary | ICD-10-CM | POA: Insufficient documentation

## 2023-08-19 DIAGNOSIS — R7303 Prediabetes: Secondary | ICD-10-CM | POA: Diagnosis present

## 2023-08-19 LAB — ECHOCARDIOGRAM COMPLETE
Area-P 1/2: 5.84 cm2
S' Lateral: 3.2 cm

## 2023-08-19 MED ORDER — LISINOPRIL 40 MG PO TABS: 40.0000 mg | ORAL_TABLET | Freq: Every day | ORAL | 1 refills | Status: DC

## 2023-08-19 MED ORDER — PERFLUTREN LIPID MICROSPHERE
1.0000 mL | INTRAVENOUS | Status: AC | PRN
Start: 2023-08-19 — End: 2023-08-19
  Administered 2023-08-19: 2 mL via INTRAVENOUS

## 2023-08-19 NOTE — Patient Instructions (Addendum)
It was nice meeting you two today  We would like your blood pressure to be less than 130/80  Please continue your amlodipine 5mg . I have increased your lisinopril to 40mg  once a day  The blood pressure cuffs we recommend are made by [employer].   I recommend beginning to check your blood pressure at home  I will complete the prior authorizations for Zepbound and [facility] for you and let you know the response  Please call or message with any questions  Laural Golden, PharmD, BCACP, CDCES, CPP [address] [place], [place], [address] Phone: [phone], Fax: [phone]

## 2023-08-19 NOTE — Telephone Encounter (Signed)
Pharmacy Patient Advocate Encounter  Received notification from [facility] that Prior Authorization for zepbound has been DENIED.  Not covered under plan    PA #/Case ID/Reference #: [id number]

## 2023-08-19 NOTE — Progress Notes (Signed)
Patient ID: Randy Navarro                 DOB: [birth year]                      MRN: [id number]     HPI: Randy Navarro is a 42 y.o. male referred by Dr. Bjorn Pippin to HTN clinic. PMH is significant for HTN, obesity, and HLD. Seen at ED on 10/17 for elevated BP when he was out of medication.  Patient presents today with wife to discuss BP and obesity. PCP attempted to put him on Zepbound but copay was >1000 dollars. He does not know if a PA was attempted.  Believes ED visit was due to running out of lisinopril. Reports pharmacy had incorrect prescriber on file and therefore was not receiving refill requests. Currently takes amlodipine 5mg  in morning and lisinopril 20mg  in evening. No adverse effects reported.  Works as Museum/gallery conservator for [employer] which he believes contributes to BP increases since job is stressful. Formerly was a Sports coach. Has a BP cuff at home but he is not currently using.  Current HTN meds:  Amlodipine 5mg  daily Lisinopril 20mg  daily  BP goal: <130/80  Wt Readings from Last 3 Encounters:  07/29/23 (!) 428 lb (194.1 kg)  07/22/23 (!) 427 lb (193.7 kg)  05/20/23 (!) 430 lb (195 kg)   BP Readings from Last 3 Encounters:  08/19/23 (!) 145/95  07/30/23 (!) 148/91  07/22/23 (!) 142/96   Pulse Readings from Last 3 Encounters:  08/19/23 81  07/30/23 85  07/22/23 88    Renal function: CrCl cannot be calculated (Patient's most recent lab result is older than the maximum 21 days allowed.).  Past Medical History:  Diagnosis Date   ADD (attention deficit disorder)    ADHD (attention deficit hyperactivity disorder)    Asthma    Back pain    Blood pressure elevated without history of HTN    Heartburn    Hypertension    IBS (irritable bowel syndrome)    Joint pain    Pneumonia age 19 year   SOBOE (shortness of breath on exertion)    Swelling of both lower extremities     Current Outpatient Medications on File Prior to Visit  Medication Sig  Dispense Refill   albuterol (VENTOLIN HFA) 108 (90 Base) MCG/ACT inhaler INHALE 2 PUFFS INTO THE LUNGS EVERY 6 HOURS AS NEEDED FOR WHEEZING OR SHORTNESS OF BREATH 6.7 each 1   amLODipine (NORVASC) 5 MG tablet Take 1 tablet (5 mg total) by mouth daily. 90 tablet 3   furosemide (LASIX) 40 MG tablet Take 40 mg by mouth daily.     Magnesium 200 MG TABS Take by mouth.     methocarbamol (ROBAXIN) 500 MG tablet TAKE 1 TABLET BY MOUTH TWICE A DAY 60 tablet 0   naproxen (NAPROSYN) 500 MG tablet Take 1 tablet (500 mg total) by mouth 2 (two) times daily as needed for moderate pain. 30 tablet 3   tirzepatide (ZEPBOUND) 2.5 MG/0.5ML Pen Inject 2.5 mg into the skin once a week. (Patient not taking: Reported on 07/22/2023) 2 mL 0   Turmeric (QC TUMERIC COMPLEX PO) Take by mouth.     No current facility-administered medications on file prior to visit.    Allergies  Allergen Reactions   Shellfish Allergy Nausea And Vomiting     Assessment/Plan:  1. Hypertension -  HYPERTENSION CONTROL Vitals:   08/19/23 1159 08/19/23  1207  BP: (!) 144/92 (!) 145/95    The patient's blood pressure is elevated above target today.  In order to address the patient's elevated BP: A current anti-hypertensive medication was adjusted today.    Patient BP in room 145/95 which is above goal of <130/80. Will increase lisinopril to 40mg  at this time. Encouraged patient to pick up [employer] BP cuff and begin monitoring at home. Will follow up in 1 month and check BMP.  2. Obesity - Patient BMI 53.37 placing him in severe obesity category. Wife currently on Wegovy. Using [employer], educated patient on mechanism of action, storage, site selection, administration, and possible adverse effects. Will complete PA and contact patient with which one is approved. Recheck in 1 month.  Laural Golden, PharmD, BCACP, CDCES, CPP [address] [place], [place], [address] Phone: [phone], Fax: [phone]

## 2023-08-19 NOTE — Telephone Encounter (Deleted)
Pharmacy Patient Advocate Encounter   Received notification from Pt Calls Messages that prior authorization for zepbound is required/requested.   Insurance verification completed.   The patient is insured through [facility] .   Per test claim: PA required; PA submitted to above mentioned insurance via CoverMyMeds Key/confirmation #/EOC [facility] Status is pending

## 2023-08-19 NOTE — Telephone Encounter (Signed)
Please complete PA for [facility] and/or Zepbound

## 2023-09-06 ENCOUNTER — Encounter: Payer: Self-pay | Admitting: Cardiology

## 2023-09-22 ENCOUNTER — Ambulatory Visit: Payer: BC Managed Care – PPO | Attending: Cardiology

## 2023-09-22 NOTE — Progress Notes (Unsigned)
Office Visit    Patient Name: Randy Navarro Date of Encounter: 09/22/2023  Primary Care Provider:  Roe Rutherford, NP Primary Cardiologist:  Epifanio Lesches  Chief Complaint    Hypertension  Significant Past Medical History   Asthma PRN albuterol  OSA   palpitations Zio unremarkable, few episodes of tachycardia  LEE Bilateral, on furosemide 40 mg daily, recommended compression socks  obesity Previously on Zepbound, currently not covered on plan    Allergies  Allergen Reactions   Shellfish Allergy Nausea And Vomiting    History of Present Illness    Randy Navarro is a 42 y.o. male patient of Dr Bjorn Pippin, in the office today for hypertension follow up. He was seen by Laural Golden PharmD in November and his pressure was still elevated, at 145/95  Blood Pressure Goal:  130/80  Current Medications:  amlodipine 5 mg qam, lisinopril 40 mg qhs  Previously tried:    Family Hx:     Social Hx:      Tobacco:  Alcohol:  Caffeine: Diet:      Exercise:   Home BP readings:      Adherence Assessment  Do you ever forget to take your medication? [] Yes [] No  Do you ever skip doses due to side effects? [] Yes [] No  Do you have trouble affording your medicines? [] Yes [] No  Are you ever unable to pick up your medication due to transportation difficulties? [] Yes [] No  Do you ever stop taking your medications because you don't believe they are helping? [] Yes [] No  Do you check your weight daily? [] Yes [] No   Adherence strategy: ***  Barriers to obtaining medications: ***     Accessory Clinical Findings    Lab Results  Component Value Date   CREATININE 0.98 07/22/2023   BUN 19 07/22/2023   NA 140 07/22/2023   K 4.0 07/22/2023   CL 99 07/22/2023   CO2 23 07/22/2023   Lab Results  Component Value Date   ALT 24 07/22/2023   AST 18 07/22/2023   ALKPHOS 95 07/22/2023   BILITOT 0.3 07/22/2023   Lab Results  Component Value Date   HGBA1C 5.8 (H)  02/11/2021    Home Medications    Current Outpatient Medications  Medication Sig Dispense Refill   albuterol (VENTOLIN HFA) 108 (90 Base) MCG/ACT inhaler INHALE 2 PUFFS INTO THE LUNGS EVERY 6 HOURS AS NEEDED FOR WHEEZING OR SHORTNESS OF BREATH 6.7 each 1   amLODipine (NORVASC) 5 MG tablet Take 1 tablet (5 mg total) by mouth daily. 90 tablet 3   furosemide (LASIX) 40 MG tablet Take 40 mg by mouth daily.     lisinopril (ZESTRIL) 40 MG tablet Take 1 tablet (40 mg total) by mouth daily. 90 tablet 1   Magnesium 200 MG TABS Take by mouth.     methocarbamol (ROBAXIN) 500 MG tablet TAKE 1 TABLET BY MOUTH TWICE A DAY 60 tablet 0   naproxen (NAPROSYN) 500 MG tablet Take 1 tablet (500 mg total) by mouth 2 (two) times daily as needed for moderate pain. 30 tablet 3   tirzepatide (ZEPBOUND) 2.5 MG/0.5ML Pen Inject 2.5 mg into the skin once a week. (Patient not taking: Reported on 07/22/2023) 2 mL 0   Turmeric (QC TUMERIC COMPLEX PO) Take by mouth.     No current facility-administered medications for this visit.     No BP recorded.  {Refresh Note OR Click here to enter BP  :1}***   Assessment & Plan    No  problem-specific Assessment & Plan notes found for this encounter.   Phillips Hay PharmD CPP [facility]  [address] [place], [place] [address] [phone]

## 2023-10-31 NOTE — Progress Notes (Unsigned)
Cardiology Office Note:    Date:  11/04/2023   ID:  Randy Navarro, DOB May 07, 1981, MRN [id number]  PCP:  Roe Rutherford, NP  Cardiologist:  None  Electrophysiologist:  None   Referring MD: Roe Rutherford, NP   Chief Complaint  Patient presents with   Palpitations    History of Present Illness:    Randy Navarro is a 43 y.o. male with a hx of hypertension, ADHD, asthma, OSA who presentes for follow-up.  Initial clinic visit was 07/22/2023.  He was seen in ED on 05/20/23 for palpitations, work-up unremarkable.  Troponins negative x 2, BNP 168.  He reports that prior to ED visit he felt fluttering feeling in chest that lasted about an hour.  Previously had had episodes of this but only last for few seconds.  The day following his ED visit he had another episode that lasted for about 15 minutes.  Currently is having episodes intermittently, over the last week just had 1 episode but only lasted for 5 seconds.  Does report he has lower extremity edema, has been taking Lasix 40 mg daily.  He denies any chest pain, dyspnea, lightheadedness, syncope, or palpitations.  He does not smoke but chews tobacco, has for 24 years.  Family history includes paternal grandfather had multiple MIs, thinks first was in the 70s.  Zio patch x 10 days 07/2023 showed 2 episodes of NSVT with longest lasting 4 beats, 6 episodes of SVT with longest lasting 7 beats.  Echocardiogram 08/19/2023 showed normal biventricular function, no significant valvular disease.  Since last clinic visit, he reports he is doing okay.  Palpitations have improved, reports has had 1-2 episodes but just last for few seconds.  He reports lower extremity edema is under control on his Lasix.  He denies any chest pain or dyspnea.    Wt Readings from Last 3 Encounters:  11/04/23 (!) 415 lb 9.6 oz (188.5 kg)  08/19/23 (!) 421 lb 6.4 oz (191.1 kg)  07/29/23 (!) 428 lb (194.1 kg)     Past Medical History:  Diagnosis Date   ADD (attention deficit  disorder)    ADHD (attention deficit hyperactivity disorder)    Asthma    Back pain    Blood pressure elevated without history of HTN    Heartburn    Hypertension    IBS (irritable bowel syndrome)    Joint pain    Pneumonia age 65 year   SOBOE (shortness of breath on exertion)    Swelling of both lower extremities     No past surgical history on file.  Current Medications: Current Meds  Medication Sig   albuterol (VENTOLIN HFA) 108 (90 Base) MCG/ACT inhaler INHALE 2 PUFFS INTO THE LUNGS EVERY 6 HOURS AS NEEDED FOR WHEEZING OR SHORTNESS OF BREATH   amLODipine (NORVASC) 10 MG tablet Take 1 tablet (10 mg total) by mouth daily.   furosemide (LASIX) 40 MG tablet Take 40 mg by mouth daily.   lisinopril (ZESTRIL) 40 MG tablet Take 1 tablet (40 mg total) by mouth daily.   Magnesium 200 MG TABS Take by mouth.   methocarbamol (ROBAXIN) 500 MG tablet TAKE 1 TABLET BY MOUTH TWICE A DAY   naproxen (NAPROSYN) 500 MG tablet Take 1 tablet (500 mg total) by mouth 2 (two) times daily as needed for moderate pain.   Turmeric (QC TUMERIC COMPLEX PO) Take by mouth.   [DISCONTINUED] amLODipine (NORVASC) 5 MG tablet Take 1 tablet (5 mg total) by mouth daily.  Allergies:   Shellfish allergy   Social History   Socioeconomic History   Marital status: Married    Spouse name: Engineer, agricultural   Number of children: 1   Years of education: Not on file   Highest education level: Not on file  Occupational History   Occupation: Engineer, materials   Occupation: Engineer, water  Tobacco Use   Smoking status: Former    Current packs/day: 0.10    Types: Cigarettes   Smokeless tobacco: Current    Types: Chew   Tobacco comments:     about 20 yrs.  Vaping Use   Vaping status: Never Used  Substance and Sexual Activity   Alcohol use: Yes    Alcohol/week: 0.0 standard drinks of alcohol    Comment: 4 beers per month   Drug use: Never   Sexual activity: Not on file  Other Topics Concern   Not on file   Social History Narrative   Not on file   Social Drivers of Health   Financial Resource Strain: Low Risk  (05/07/2023)   Received from [facility]   Overall Financial Resource Strain (CARDIA)    Difficulty of Paying Living Expenses: Not very hard  Food Insecurity: No Food Insecurity (05/07/2023)   Received from [facility]   Hunger Vital Sign    Worried About Running Out of Food in the Last Year: Never true    Ran Out of Food in the Last Year: Never true  Transportation Needs: No Transportation Needs (05/07/2023)   Received from [facility] - Transportation    Lack of Transportation (Medical): No    Lack of Transportation (Non-Medical): No  Physical Activity: Inactive (05/07/2023)   Received from [facility]   Exercise Vital Sign    Days of Exercise per Week: 1 day    Minutes of Exercise per Session: 0 min  Stress: No Stress Concern Present (05/07/2023)   Received from [facility] - Occupational Stress Questionnaire    Feeling of Stress : Only a little  Social Connections: Moderately Integrated (05/07/2023)   Received from [facility]   Social Network    How would you rate your social network (family, work, friends)?: Adequate participation with social networks     Family History: The patient's family history includes Hypertension in his father and mother; Kidney disease in his mother.  ROS:   Please see the history of present illness.     All other systems reviewed and are negative.  EKGs/Labs/Other Studies Reviewed:    The following studies were reviewed today:   EKG:  07/22/2023: Normal sinus rhythm, rate 88, nonspecific T wave flattening, motion artifact  Recent Labs: 05/20/2023: Hemoglobin 13.6; Magnesium 1.9; Platelets 282; TSH 1.930 07/22/2023: ALT 24; BNP 65.0; BUN 19; Creatinine, Ser 0.98; Potassium 4.0; Sodium 140  Recent Lipid Panel    Component Value Date/Time   CHOL 185 12/30/2021 0910    TRIG 218 (H) 12/30/2021 0910   HDL 34 (L) 12/30/2021 0910   CHOLHDL 5.4 (H) 12/30/2021 0910   LDLCALC 113 (H) 12/30/2021 0910    Physical Exam:    VS:  BP (!) 168/92 (BP Location: Right Arm, Patient Position: Sitting, Cuff Size: Normal)   Pulse 94   Ht 6\' 3"  (1.905 m)   Wt (!) 415 lb 9.6 oz (188.5 kg)   SpO2 97%   BMI 51.95 kg/m     Wt Readings from Last 3 Encounters:  11/04/23 Marland Kitchen)  415 lb 9.6 oz (188.5 kg)  08/19/23 (!) 421 lb 6.4 oz (191.1 kg)  07/29/23 (!) 428 lb (194.1 kg)     GEN:  Well nourished, well developed in no acute distress HEENT: Normal NECK: No JVD; No carotid bruits LYMPHATICS: No lymphadenopathy CARDIAC: RRR, no murmurs, rubs, gallops RESPIRATORY:  Clear to auscultation without rales, wheezing or rhonchi  ABDOMEN: Soft, non-tender, non-distended MUSCULOSKELETAL:  1+ LE edema; No deformity  SKIN: Warm and dry NEUROLOGIC:  Alert and oriented x 3 PSYCHIATRIC:  Normal affect   ASSESSMENT:    1. Palpitations   2. Lower extremity edema   3. Essential hypertension   4. Morbid obesity (HCC)     PLAN:    Palpitations: Zio patch x 10 days 07/2023 showed 2 episodes of NSVT with longest lasting 4 beats, 6 episodes of SVT with longest lasting 7 beats.  Echocardiogram 08/19/2023 showed normal biventricular function, no significant valvular disease. -Reports palpitations have improved  Lower extremity edema: 1+ bilateral lower extremity edema on exam.  He is on Lasix 40 mg daily, would continue.  Recommend compression stockings while awake.  Echocardiogram unremarkable as above.  Check BMET, magnesium  Hypertension: on lisinopril 40 mg daily, Lasix 40 mg daily, amlodipine 5 mg daily.  BP elevated, recommend increase amlodipine to 10 mg daily.  Asked to check BP daily for next 2 weeks and let us know results  Morbid obesity: Body mass index is 51.95 kg/m.  Follows with weight loss clinic at [facility]  OSA: reports compliance with CPAP   RTC in 1  year   Medication Adjustments/Labs and Tests Ordered: Current medicines are reviewed at length with the patient today.  Concerns regarding medicines are outlined above.  Orders Placed This Encounter  Procedures   Basic metabolic panel   Magnesium   Meds ordered this encounter  Medications   amLODipine (NORVASC) 10 MG tablet    Sig: Take 1 tablet (10 mg total) by mouth daily.    Dispense:  90 tablet    Refill:  3    Patient Instructions  Medication Instructions:  Stop Amlodipine 5 mg daily Start Amlodipine 10 mg daily Continue all current medications *If you need a refill on your cardiac medications before your next appointment, please call your pharmacy*   Lab Work: Bmet, mg today If you have labs (blood work) drawn today and your tests are completely normal, you will receive your results only by: MyChart Message (if you have MyChart) OR A paper copy in the mail If you have any lab test that is abnormal or we need to change your treatment, we will call you to review the results.   Testing/Procedures: none   Follow-Up: At [facility], you and your health needs are our priority.  As part of our continuing mission to provide you with exceptional heart care, we have created designated Provider Care Teams.  These Care Teams include your primary Cardiologist (physician) and Advanced Practice Providers (APPs -  Physician Assistants and Nurse Practitioners) who all work together to provide you with the care you need, when you need it.  We recommend signing up for the patient portal called "MyChart".  Sign up information is provided on this After Visit Summary.  MyChart is used to connect with patients for Virtual Visits (Telemedicine).  Patients are able to view lab/test results, encounter notes, upcoming appointments, etc.  Non-urgent messages can be sent to your provider as well.   To learn more about what you can  do with MyChart, go to [email or web address].    Your  next appointment:   1 year(s)  Provider:   Dr. Bjorn Pippin  Other Instructions Please check blood daily for next couple of weeks and send via mychart       Signed, Little Ishikawa, MD  11/04/2023 5:32 PM    [facility]

## 2023-11-04 ENCOUNTER — Encounter: Payer: Self-pay | Admitting: Cardiology

## 2023-11-04 ENCOUNTER — Ambulatory Visit: Payer: BC Managed Care – PPO | Attending: Cardiology | Admitting: Cardiology

## 2023-11-04 VITALS — BP 168/92 | HR 94 | Ht 75.0 in | Wt >= 6400 oz

## 2023-11-04 DIAGNOSIS — R600 Localized edema: Secondary | ICD-10-CM | POA: Diagnosis not present

## 2023-11-04 DIAGNOSIS — R002 Palpitations: Secondary | ICD-10-CM

## 2023-11-04 DIAGNOSIS — I10 Essential (primary) hypertension: Secondary | ICD-10-CM

## 2023-11-04 MED ORDER — AMLODIPINE BESYLATE 10 MG PO TABS: 10.0000 mg | ORAL_TABLET | Freq: Every day | ORAL | 3 refills | Status: DC

## 2023-11-04 NOTE — Patient Instructions (Signed)
Medication Instructions:  Stop Amlodipine 5 mg daily Start Amlodipine 10 mg daily Continue all current medications *If you need a refill on your cardiac medications before your next appointment, please call your pharmacy*   Lab Work: Bmet, mg today If you have labs (blood work) drawn today and your tests are completely normal, you will receive your results only by: MyChart Message (if you have MyChart) OR A paper copy in the mail If you have any lab test that is abnormal or we need to change your treatment, we will call you to review the results.   Testing/Procedures: none   Follow-Up: At [facility], you and your health needs are our priority.  As part of our continuing mission to provide you with exceptional heart care, we have created designated Provider Care Teams.  These Care Teams include your primary Cardiologist (physician) and Advanced Practice Providers (APPs -  Physician Assistants and Nurse Practitioners) who all work together to provide you with the care you need, when you need it.  We recommend signing up for the patient portal called "MyChart".  Sign up information is provided on this After Visit Summary.  MyChart is used to connect with patients for Virtual Visits (Telemedicine).  Patients are able to view lab/test results, encounter notes, upcoming appointments, etc.  Non-urgent messages can be sent to your provider as well.   To learn more about what you can do with MyChart, go to [email or web address].    Your next appointment:   1 year(s)  Provider:   Dr. Bjorn Pippin  Other Instructions Please check blood daily for next couple of weeks and send via mychart

## 2023-11-05 ENCOUNTER — Encounter: Payer: Self-pay | Admitting: *Deleted

## 2023-11-05 LAB — BASIC METABOLIC PANEL
BUN/Creatinine Ratio: 16 (ref 9–20)
BUN: 17 mg/dL (ref 6–24)
CO2: 27 mmol/L (ref 20–29)
Calcium: 9 mg/dL (ref 8.7–10.2)
Chloride: 98 mmol/L (ref 96–106)
Creatinine, Ser: 1.05 mg/dL (ref 0.76–1.27)
Glucose: 90 mg/dL (ref 70–99)
Potassium: 4.2 mmol/L (ref 3.5–5.2)
Sodium: 140 mmol/L (ref 134–144)
eGFR: 91 mL/min/{1.73_m2} (ref >59)

## 2023-11-05 LAB — MAGNESIUM: Magnesium: 1.9 mg/dL (ref 1.6–2.3)

## 2024-02-08 ENCOUNTER — Other Ambulatory Visit: Payer: Self-pay | Admitting: Cardiology

## 2024-02-08 DIAGNOSIS — I10 Essential (primary) hypertension: Secondary | ICD-10-CM

## 2024-02-14 ENCOUNTER — Other Ambulatory Visit: Payer: Self-pay | Admitting: Orthopedic Surgery

## 2024-02-14 DIAGNOSIS — M5451 Vertebrogenic low back pain: Secondary | ICD-10-CM

## 2024-08-27 ENCOUNTER — Ambulatory Visit
Admission: EM | Admit: 2024-08-27 | Discharge: 2024-08-27 | Disposition: A | Discharge status: Home / Self Care | Attending: Family Medicine | Admitting: Family Medicine

## 2024-08-27 DIAGNOSIS — R030 Elevated blood-pressure reading, without diagnosis of hypertension: Secondary | ICD-10-CM

## 2024-08-27 NOTE — ED Triage Notes (Addendum)
 Pt reports elevated BP started today. Denies blurred vision, headache, nausea. Pt is on both amlodipine  10 mg and lisinopril  40 mg states he has taken both today around 11:00 am.

## 2024-08-27 NOTE — Discharge Instructions (Signed)
 Go to the emergency department if having worsening symptoms at any time.  Call your primary care provider first thing tomorrow morning and obtain their advice and recommendations additionally.  DASH diet, good fluid intake, try to get good rest and avoid medications such as ibuprofen or Aleve  that can further elevate your blood pressure.

## 2024-08-28 ENCOUNTER — Encounter (HOSPITAL_BASED_OUTPATIENT_CLINIC_OR_DEPARTMENT_OTHER): Payer: Self-pay | Admitting: Emergency Medicine

## 2024-08-28 ENCOUNTER — Other Ambulatory Visit: Payer: Self-pay

## 2024-08-28 ENCOUNTER — Emergency Department (HOSPITAL_BASED_OUTPATIENT_CLINIC_OR_DEPARTMENT_OTHER)
Admission: EM | Admit: 2024-08-28 | Discharge: 2024-08-28 | Disposition: A | Discharge status: Home / Self Care | Attending: Emergency Medicine | Admitting: Emergency Medicine

## 2024-08-28 DIAGNOSIS — Z79899 Other long term (current) drug therapy: Secondary | ICD-10-CM | POA: Insufficient documentation

## 2024-08-28 DIAGNOSIS — I10 Essential (primary) hypertension: Secondary | ICD-10-CM | POA: Insufficient documentation

## 2024-08-28 DIAGNOSIS — R030 Elevated blood-pressure reading, without diagnosis of hypertension: Secondary | ICD-10-CM | POA: Diagnosis present

## 2024-08-28 NOTE — Discharge Instructions (Addendum)
 Please return if symptoms worsen including headache chest pain or other concerning symptoms.  Follow-up closely with your primary care doctor for further blood pressure management.

## 2024-08-28 NOTE — ED Provider Notes (Signed)
 [place] [facility] AT [facility] Provider Note   CSN: [id number] Arrival date & time: 08/28/24  0945     Patient presents with: Hypertension   Randy Navarro is a 43 y.o. male.   Patient here for high blood pressure.  He denies any chest pain headache weakness numbness tingling.  He is noticed at high the last couple days in the 200 range.  He is on Lasix  amlodipine  and lisinopril .  He is not having any weakness numbness tingling.  Denies any vision loss.  He has a primary care doctor.  Has been compliant with his medications but he has missed some Lasix .  He does not have any shortness of breath or leg swelling.  The history is provided by the patient.       Prior to Admission medications   Medication Sig Start Date End Date Taking? Authorizing Provider  albuterol  (VENTOLIN  HFA) 108 (90 Base) MCG/ACT inhaler INHALE 2 PUFFS INTO THE LUNGS EVERY 6 HOURS AS NEEDED FOR WHEEZING OR SHORTNESS OF BREATH 02/15/23   Cook, Jayce G, DO  amLODipine  (NORVASC ) 10 MG tablet Take 1 tablet (10 mg total) by mouth daily. 11/04/23 02/02/24  Kate Lonni CROME, MD  furosemide  (LASIX ) 40 MG tablet Take 40 mg by mouth daily.    [provider]  lisinopril  (ZESTRIL ) 40 MG tablet TAKE 1 TABLET BY MOUTH EVERY DAY 02/10/24   Kate Lonni CROME, MD  Magnesium 200 MG TABS Take by mouth.    [provider]  methocarbamol  (ROBAXIN ) 500 MG tablet TAKE 1 TABLET BY MOUTH TWICE A DAY 04/19/23   Cook, Jayce G, DO  naproxen  (NAPROSYN ) 500 MG tablet Take 1 tablet (500 mg total) by mouth 2 (two) times daily as needed for moderate pain. 12/30/21   Cook, Jayce G, DO  tirzepatide  (ZEPBOUND ) 2.5 MG/0.5ML Pen Inject 2.5 mg into the skin once a week. Patient not taking: Reported on 11/04/2023 10/23/22   Cook, Jayce G, DO  Turmeric (QC TUMERIC COMPLEX PO) Take by mouth.    [provider]    Allergies: Alpha-gal    Review of Systems  Updated Vital Signs BP (!) 178/110    Pulse 97   Temp 97.8 F (36.6 C) (Oral)   Resp 18   SpO2 99%   Physical Exam Vitals and nursing note reviewed.  Constitutional:      General: He is not in acute distress.    Appearance: He is well-developed. He is not ill-appearing.  HENT:     Head: Normocephalic and atraumatic.     Nose: Nose normal.     Mouth/Throat:     Mouth: Mucous membranes are moist.  Eyes:     Extraocular Movements: Extraocular movements intact.     Conjunctiva/sclera: Conjunctivae normal.     Pupils: Pupils are equal, round, and reactive to light.  Cardiovascular:     Rate and Rhythm: Normal rate and regular rhythm.     Pulses: Normal pulses.     Heart sounds: Normal heart sounds. No murmur heard. Pulmonary:     Effort: Pulmonary effort is normal. No respiratory distress.     Breath sounds: Normal breath sounds.  Abdominal:     General: Abdomen is flat.     Palpations: Abdomen is soft.     Tenderness: There is no abdominal tenderness.  Musculoskeletal:        General: No swelling.     Cervical back: Normal range of motion and neck supple.  Skin:  General: Skin is warm and dry.     Capillary Refill: Capillary refill takes less than 2 seconds.  Neurological:     General: No focal deficit present.     Mental Status: He is alert and oriented to person, place, and time.     Cranial Nerves: No cranial nerve deficit.     Sensory: No sensory deficit.     Motor: No weakness.     Coordination: Coordination normal.     Comments: Normal strength sensation speech visual fields coordination  Psychiatric:        Mood and Affect: Mood normal.     (all labs ordered are listed, but only abnormal results are displayed) Labs Reviewed - No data to display  EKG: None  Radiology: No results found.   Procedures   Medications Ordered in the ED - No data to display                                  Medical Decision Making  Aquiles Ruffini is here with blood pressure concern.  Blood pressure was  178/110 upon arrival but after recheck was 160/100.  Ultimately he is got asymptomatic hypertension.  Is not having any headache weakness numbness tingling chest pain.  He is very well-appearing.  Neurologically intact.  He does have a primary care doctor.  He is been compliant with his amlodipine  and lisinopril .  He has missed some Lasix  doses but he has been pretty good about taking his meds overall.  Ultimately EKG looks okay there is no ischemic changes.  Not have any chest pain.  Have no concern for stroke ACS or other acute process.  Blood pressure is only minimally elevated here.  He does feel like he is able to follow-up with his primary care doctor pretty easily and we will have him follow-up closely with them to further direct his blood pressure management given that he is already on a couple blood pressure meds.  He understands to return if he develops chest pain headache or other concerning symptoms.  But at this time I do not think we need to do any further workup.  Discharged in good condition.  Understands return precautions.  This chart was dictated using voice recognition software.  Despite best efforts to proofread,  errors can occur which can change the documentation meaning.      Final diagnoses:  Asymptomatic hypertension    ED Discharge Orders     None          Ruthe Cornet, DO 08/28/24 1010

## 2024-08-28 NOTE — ED Triage Notes (Signed)
 Reports HTN starting yesterday. Denies vision trouble or headache. Denies changes to medications.

## 2024-08-30 NOTE — ED Provider Notes (Signed)
 [facility]    CSN: [id number] Arrival date & time: 08/27/24  1503      History   Chief Complaint No chief complaint on file.   HPI Randy Navarro is a 43 y.o. male.   Patient presenting today with concern regarding his elevated blood pressure reading at home.  He states he is compliant with amlodipine  and lisinopril  and typically fairly compliant with his Lasix  for his hypertension and lower extremity edema.  No new lifestyle changes, stressors or other changes recently.  States his blood pressure is usually high normal but today at home his reading was about 200/100s.  Denies chest pain, headache, blurry vision, nausea, diaphoresis, weakness.    Past Medical History:  Diagnosis Date   ADD (attention deficit disorder)    ADHD (attention deficit hyperactivity disorder)    Asthma    Back pain    Blood pressure elevated without history of HTN    Heartburn    Hypertension    IBS (irritable bowel syndrome)    Joint pain    Pneumonia age 43 year   SOBOE (shortness of breath on exertion)    Swelling of both lower extremities     Patient Active Problem List   Diagnosis Date Noted   URI (upper respiratory infection) 10/23/2022   Mixed hyperlipidemia 04/15/2022   Prediabetes 03/19/2021   Gastroesophageal reflux disease 02/11/2021   Morbid obesity (HCC) 01/07/2021   Essential hypertension 01/07/2021   Herniated lumbar intervertebral disc 01/07/2021   Mild persistent asthma 10/08/2014    History reviewed. No pertinent surgical history.     Home Medications    Prior to Admission medications   Medication Sig Start Date End Date Taking? Authorizing Provider  albuterol  (VENTOLIN  HFA) 108 (90 Base) MCG/ACT inhaler INHALE 2 PUFFS INTO THE LUNGS EVERY 6 HOURS AS NEEDED FOR WHEEZING OR SHORTNESS OF BREATH 02/15/23   Cook, Jayce G, DO  amLODipine  (NORVASC ) 10 MG tablet Take 1 tablet (10 mg total) by mouth daily. 11/04/23 02/02/24  Kate Lonni CROME, MD  furosemide   (LASIX ) 40 MG tablet Take 40 mg by mouth daily.    [provider]  lisinopril  (ZESTRIL ) 40 MG tablet TAKE 1 TABLET BY MOUTH EVERY DAY 02/10/24   Kate Lonni CROME, MD  Magnesium 200 MG TABS Take by mouth.    [provider]  methocarbamol  (ROBAXIN ) 500 MG tablet TAKE 1 TABLET BY MOUTH TWICE A DAY 04/19/23   Cook, Jayce G, DO  naproxen  (NAPROSYN ) 500 MG tablet Take 1 tablet (500 mg total) by mouth 2 (two) times daily as needed for moderate pain. 12/30/21   Cook, Jayce G, DO  tirzepatide  (ZEPBOUND ) 2.5 MG/0.5ML Pen Inject 2.5 mg into the skin once a week. Patient not taking: Reported on 11/04/2023 10/23/22   Cook, Jayce G, DO  Turmeric (QC TUMERIC COMPLEX PO) Take by mouth.    [provider]    Family History Family History  Problem Relation Age of Onset   Hypertension Mother    Kidney disease Mother    Hypertension Father     Social History Social History   Tobacco Use   Smoking status: Former    Current packs/day: 0.10    Types: Cigarettes   Smokeless tobacco: Current    Types: Chew   Tobacco comments:     about 20 yrs.  Vaping Use   Vaping status: Never Used  Substance Use Topics   Alcohol use: Yes    Alcohol/week: 0.0 standard drinks of alcohol  Comment: 4 beers per month   Drug use: Never     Allergies   Alpha-gal   Review of Systems Review of Systems Per HPI  Physical Exam Triage Vital Signs ED Triage Vitals  Encounter Vitals Group     BP 08/27/24 1510 (!) 196/115     Girls Systolic BP Percentile --      Girls Diastolic BP Percentile --      Boys Systolic BP Percentile --      Boys Diastolic BP Percentile --      Pulse Rate 08/27/24 1510 (!) 107     Resp 08/27/24 1510 (!) 22     Temp 08/27/24 1510 98.3 F (36.8 C)     Temp Source 08/27/24 1510 Oral     SpO2 08/27/24 1510 97 %     Weight --      Height --      Head Circumference --      Peak Flow --      Pain Score 08/27/24 1514 0     Pain Loc --      Pain Education  --      Exclude from Growth Chart --    No data found.  Updated Vital Signs BP (!) 160/100 (BP Location: Right Arm)   Pulse 93   Temp 98.3 F (36.8 C) (Oral)   Resp (!) 22   SpO2 94%   Visual Acuity Right Eye Distance:   Left Eye Distance:   Bilateral Distance:    Right Eye Near:   Left Eye Near:    Bilateral Near:     Physical Exam Vitals and nursing note reviewed.  Constitutional:      Appearance: He is obese.  HENT:     Head: Atraumatic.     Mouth/Throat:     Mouth: Mucous membranes are moist.  Eyes:     Extraocular Movements: Extraocular movements intact.     Conjunctiva/sclera: Conjunctivae normal.  Cardiovascular:     Rate and Rhythm: Normal rate and regular rhythm.  Pulmonary:     Effort: Pulmonary effort is normal.     Breath sounds: Normal breath sounds.  Musculoskeletal:        General: Normal range of motion.     Cervical back: Normal range of motion and neck supple.  Skin:    General: Skin is warm and dry.  Neurological:     Mental Status: He is alert and oriented to person, place, and time.  Psychiatric:        Mood and Affect: Mood normal.        Thought Content: Thought content normal.        Judgment: Judgment normal.      UC Treatments / Results  Labs (all labs ordered are listed, but only abnormal results are displayed) Labs Reviewed - No data to display  EKG   Radiology No results found.  Procedures Procedures (including critical care time)  Medications Ordered in UC Medications - No data to display  Initial Impression / Assessment and Plan / UC Course  I have reviewed the triage vital signs and the nursing notes.  Pertinent labs & imaging results that were available during my care of the patient were reviewed by me and considered in my medical decision making (see chart for details).     Significantly hypertensive in triage, slightly improved on recheck to 160/100.  Otherwise vital signs reassuring.  He is well-appearing  and in no acute distress and states he  is currently asymptomatic.  EKG today showing normal sinus rhythm at 98 bpm without acute ST or T wave changes.  Discussed close follow-up tomorrow with primary care for further recommendations regarding medication management and close monitoring but to go to the emergency department for worsening symptoms at any time.  Final Clinical Impressions(s) / UC Diagnoses   Final diagnoses:  Elevated blood pressure reading     Discharge Instructions      Go to the emergency department if having worsening symptoms at any time.  Call your primary care provider first thing tomorrow morning and obtain their advice and recommendations additionally.  DASH diet, good fluid intake, try to get good rest and avoid medications such as ibuprofen or Aleve  that can further elevate your blood pressure.    ED Prescriptions   None    PDMP not reviewed this encounter.   Stuart Vernell Norris, [place] 08/30/24 [phone]

## 2024-10-25 ENCOUNTER — Other Ambulatory Visit: Payer: Self-pay | Admitting: Cardiology

## 2024-11-17 ENCOUNTER — Encounter: Payer: Self-pay | Admitting: Internal Medicine

## 2025-01-01 ENCOUNTER — Ambulatory Visit: Admitting: Gastroenterology
